# Patient Record
Sex: Male | Born: 1980 | Race: Black or African American | Hispanic: No | Marital: Single | Smoking: Current every day smoker
Health system: Southern US, Community
[De-identification: ages and names within clinical notes are randomized; demographics above are authoritative.]

## PROBLEM LIST (undated history)

## (undated) DIAGNOSIS — S069XAA Unspecified intracranial injury with loss of consciousness status unknown, initial encounter: Secondary | ICD-10-CM

## (undated) DIAGNOSIS — S069X9A Unspecified intracranial injury with loss of consciousness of unspecified duration, initial encounter: Secondary | ICD-10-CM

## (undated) HISTORY — PX: FRACTURE SURGERY: SHX138

---
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
## 2004-06-22 ENCOUNTER — Emergency Department (HOSPITAL_COMMUNITY): Admission: EM | Admit: 2004-06-22 | Discharge: 2004-06-22 | Payer: Self-pay | Admitting: Emergency Medicine

## 2004-10-20 ENCOUNTER — Emergency Department (HOSPITAL_COMMUNITY): Admission: EM | Admit: 2004-10-20 | Discharge: 2004-10-21 | Payer: Self-pay

## 2006-07-21 ENCOUNTER — Emergency Department (HOSPITAL_COMMUNITY): Admission: EM | Admit: 2006-07-21 | Discharge: 2006-07-21 | Payer: Self-pay | Admitting: Emergency Medicine

## 2007-05-24 ENCOUNTER — Emergency Department (HOSPITAL_COMMUNITY): Admission: EM | Admit: 2007-05-24 | Discharge: 2007-05-24 | Payer: Self-pay | Admitting: Emergency Medicine

## 2009-07-04 ENCOUNTER — Emergency Department (HOSPITAL_COMMUNITY): Admission: EM | Admit: 2009-07-04 | Discharge: 2009-07-04 | Payer: Self-pay | Admitting: Emergency Medicine

## 2010-09-11 ENCOUNTER — Emergency Department (HOSPITAL_COMMUNITY): Admission: EM | Admit: 2010-09-11 | Discharge: 2010-09-11 | Payer: Self-pay | Admitting: Emergency Medicine

## 2010-11-03 ENCOUNTER — Emergency Department (HOSPITAL_COMMUNITY)
Admission: EM | Admit: 2010-11-03 | Discharge: 2010-11-03 | Payer: Self-pay | Source: Home / Self Care | Admitting: Emergency Medicine

## 2011-01-28 LAB — GC/CHLAMYDIA PROBE AMP, GENITAL: GC Probe Amp, Genital: POSITIVE — AB

## 2011-06-09 ENCOUNTER — Ambulatory Visit: Payer: Self-pay | Attending: Orthopaedic Surgery | Admitting: Physical Therapy

## 2011-06-09 DIAGNOSIS — M25579 Pain in unspecified ankle and joints of unspecified foot: Secondary | ICD-10-CM | POA: Insufficient documentation

## 2011-06-09 DIAGNOSIS — R262 Difficulty in walking, not elsewhere classified: Secondary | ICD-10-CM | POA: Insufficient documentation

## 2011-06-09 DIAGNOSIS — M25673 Stiffness of unspecified ankle, not elsewhere classified: Secondary | ICD-10-CM | POA: Insufficient documentation

## 2011-06-09 DIAGNOSIS — IMO0001 Reserved for inherently not codable concepts without codable children: Secondary | ICD-10-CM | POA: Insufficient documentation

## 2011-06-09 DIAGNOSIS — M25676 Stiffness of unspecified foot, not elsewhere classified: Secondary | ICD-10-CM | POA: Insufficient documentation

## 2011-06-11 ENCOUNTER — Ambulatory Visit: Payer: Self-pay | Admitting: Rehabilitation

## 2011-06-12 ENCOUNTER — Ambulatory Visit: Payer: Self-pay | Admitting: Physical Therapy

## 2011-06-17 ENCOUNTER — Ambulatory Visit: Payer: Self-pay | Attending: Orthopaedic Surgery | Admitting: Physical Therapy

## 2011-06-17 DIAGNOSIS — R262 Difficulty in walking, not elsewhere classified: Secondary | ICD-10-CM | POA: Insufficient documentation

## 2011-06-17 DIAGNOSIS — M25579 Pain in unspecified ankle and joints of unspecified foot: Secondary | ICD-10-CM | POA: Insufficient documentation

## 2011-06-17 DIAGNOSIS — M25676 Stiffness of unspecified foot, not elsewhere classified: Secondary | ICD-10-CM | POA: Insufficient documentation

## 2011-06-17 DIAGNOSIS — IMO0001 Reserved for inherently not codable concepts without codable children: Secondary | ICD-10-CM | POA: Insufficient documentation

## 2011-06-17 DIAGNOSIS — M25673 Stiffness of unspecified ankle, not elsewhere classified: Secondary | ICD-10-CM | POA: Insufficient documentation

## 2011-06-18 ENCOUNTER — Ambulatory Visit: Payer: Self-pay | Admitting: Physical Therapy

## 2011-06-23 ENCOUNTER — Ambulatory Visit: Payer: Self-pay | Admitting: Physical Therapy

## 2011-06-26 ENCOUNTER — Encounter: Payer: Self-pay | Admitting: Physical Therapy

## 2011-06-30 ENCOUNTER — Ambulatory Visit: Payer: Self-pay | Admitting: Physical Therapy

## 2011-07-03 ENCOUNTER — Ambulatory Visit: Payer: Self-pay | Admitting: Physical Therapy

## 2011-07-07 ENCOUNTER — Ambulatory Visit: Payer: Self-pay | Admitting: Physical Therapy

## 2011-07-10 ENCOUNTER — Encounter: Payer: Self-pay | Admitting: Physical Therapy

## 2011-07-13 ENCOUNTER — Encounter: Payer: Self-pay | Admitting: Physical Therapy

## 2011-07-16 ENCOUNTER — Encounter: Payer: Self-pay | Admitting: Physical Therapy

## 2011-07-28 ENCOUNTER — Ambulatory Visit: Payer: Self-pay | Attending: Orthopaedic Surgery | Admitting: Physical Therapy

## 2011-07-28 DIAGNOSIS — IMO0001 Reserved for inherently not codable concepts without codable children: Secondary | ICD-10-CM | POA: Insufficient documentation

## 2011-07-28 DIAGNOSIS — R262 Difficulty in walking, not elsewhere classified: Secondary | ICD-10-CM | POA: Insufficient documentation

## 2011-07-28 DIAGNOSIS — M25579 Pain in unspecified ankle and joints of unspecified foot: Secondary | ICD-10-CM | POA: Insufficient documentation

## 2011-07-28 DIAGNOSIS — M25673 Stiffness of unspecified ankle, not elsewhere classified: Secondary | ICD-10-CM | POA: Insufficient documentation

## 2011-07-28 DIAGNOSIS — M25676 Stiffness of unspecified foot, not elsewhere classified: Secondary | ICD-10-CM | POA: Insufficient documentation

## 2011-07-30 ENCOUNTER — Ambulatory Visit: Payer: Self-pay | Admitting: Physical Therapy

## 2011-08-04 ENCOUNTER — Encounter: Payer: Self-pay | Admitting: Physical Therapy

## 2011-08-06 ENCOUNTER — Encounter: Payer: Self-pay | Admitting: Physical Therapy

## 2011-08-11 ENCOUNTER — Ambulatory Visit: Payer: Self-pay | Admitting: Physical Therapy

## 2011-08-13 ENCOUNTER — Ambulatory Visit: Payer: Self-pay | Admitting: Physical Therapy

## 2011-08-18 ENCOUNTER — Ambulatory Visit: Payer: Self-pay | Attending: Orthopaedic Surgery | Admitting: Physical Therapy

## 2011-08-18 DIAGNOSIS — IMO0001 Reserved for inherently not codable concepts without codable children: Secondary | ICD-10-CM | POA: Insufficient documentation

## 2011-08-18 DIAGNOSIS — M25579 Pain in unspecified ankle and joints of unspecified foot: Secondary | ICD-10-CM | POA: Insufficient documentation

## 2011-08-18 DIAGNOSIS — M25673 Stiffness of unspecified ankle, not elsewhere classified: Secondary | ICD-10-CM | POA: Insufficient documentation

## 2011-08-18 DIAGNOSIS — M25676 Stiffness of unspecified foot, not elsewhere classified: Secondary | ICD-10-CM | POA: Insufficient documentation

## 2011-08-18 DIAGNOSIS — R262 Difficulty in walking, not elsewhere classified: Secondary | ICD-10-CM | POA: Insufficient documentation

## 2011-08-21 ENCOUNTER — Ambulatory Visit: Payer: Self-pay | Admitting: Physical Therapy

## 2012-03-24 DIAGNOSIS — M25419 Effusion, unspecified shoulder: Secondary | ICD-10-CM | POA: Insufficient documentation

## 2012-03-24 DIAGNOSIS — IMO0002 Reserved for concepts with insufficient information to code with codable children: Secondary | ICD-10-CM | POA: Insufficient documentation

## 2012-03-24 DIAGNOSIS — F17200 Nicotine dependence, unspecified, uncomplicated: Secondary | ICD-10-CM | POA: Insufficient documentation

## 2012-03-24 DIAGNOSIS — S40019A Contusion of unspecified shoulder, initial encounter: Secondary | ICD-10-CM | POA: Insufficient documentation

## 2012-03-24 DIAGNOSIS — M25519 Pain in unspecified shoulder: Secondary | ICD-10-CM | POA: Insufficient documentation

## 2012-03-25 ENCOUNTER — Emergency Department (HOSPITAL_COMMUNITY): Payer: Self-pay

## 2012-03-25 ENCOUNTER — Encounter (HOSPITAL_COMMUNITY): Payer: Self-pay | Admitting: Emergency Medicine

## 2012-03-25 ENCOUNTER — Emergency Department (HOSPITAL_COMMUNITY)
Admission: EM | Admit: 2012-03-25 | Discharge: 2012-03-25 | Disposition: A | Discharge status: Home / Self Care | Payer: Self-pay | Attending: Emergency Medicine | Admitting: Emergency Medicine

## 2012-03-25 DIAGNOSIS — S40019A Contusion of unspecified shoulder, initial encounter: Secondary | ICD-10-CM

## 2012-03-25 MED ORDER — IBUPROFEN 600 MG PO TABS: 600.0000 mg | ORAL_TABLET | Freq: Four times a day (QID) | ORAL | Status: AC | PRN

## 2012-03-25 MED ORDER — KETOROLAC TROMETHAMINE 30 MG/ML IJ SOLN
30.0000 mg | Freq: Once | INTRAMUSCULAR | Status: AC
  Administered 2012-03-25: 30 mg via INTRAMUSCULAR
  Filled 2012-03-25: qty 1

## 2012-03-25 NOTE — ED Provider Notes (Signed)
History     CSN: [id number]  Arrival date & time 03/24/12  2334   First MD Initiated Contact with Patient 03/25/12 0124      Chief Complaint  Patient presents with  . Shoulder Injury    right    (Consider location/radiation/quality/duration/timing/severity/associated sxs/prior treatment) HPI Comments: Patient states he locked his keys in the bathroom in his own home.  He was using his right shoulder, as it better, and ran to force the door open.  He is now having shoulder pain without deformity  Patient is a 31 y.o. male presenting with shoulder injury. The history is provided by the patient.  Shoulder Injury This is a new problem. The current episode started today. The problem occurs constantly. Associated symptoms include joint swelling. Pertinent negatives include no numbness or weakness.    History reviewed. No pertinent past medical history.  Past Surgical History  Procedure Date  . Fracture surgery     bilateral ankles and plates in pelvis and rods  to right  forearm    History reviewed. No pertinent family history.  History  Substance Use Topics  . Smoking status: Current Everyday Smoker -- 0.5 packs/day for 10 years  . Smokeless tobacco: Not on file  . Alcohol Use: 2.4 oz/week    2 Cans of beer, 2 Shots of liquor per week      Review of Systems  Musculoskeletal: Positive for joint swelling. Negative for back pain.  Skin: Negative for wound.  Neurological: Negative for dizziness, weakness and numbness.    Allergies  Review of patient's allergies indicates no known allergies.  Home Medications   Current Outpatient Rx  Name Route Sig Dispense Refill  . IBUPROFEN 200 MG PO TABS Oral Take 800 mg by mouth every 8 (eight) hours as needed. For pain    . IBUPROFEN 600 MG PO TABS Oral Take 1 tablet (600 mg total) by mouth every 6 (six) hours as needed for pain. 30 tablet 0    BP 127/90  Pulse 89  Temp(Src) 99 F (37.2 C) (Oral)  Resp 20  SpO2  100%  Physical Exam  Constitutional: He is oriented to person, place, and time. He appears well-developed and well-nourished.  HENT:  Head: Normocephalic.  Eyes: Pupils are equal, round, and reactive to light.  Neck: Normal range of motion.  Cardiovascular: Normal rate.   Pulmonary/Chest: Effort normal.  Musculoskeletal:       Right shoulder: He exhibits decreased range of motion, tenderness, bony tenderness, swelling and pain. He exhibits no effusion, no crepitus, no deformity, no laceration and no spasm.       Clavicle feels intact.  No obvious deformity.  Positive distal pulses, color, and temperature of right arm.  Full range of motion at elbow, wrist, and fingers, decreased range of motion at shoulder, itself  Neurological: He is alert and oriented to person, place, and time.  Skin: Skin is warm and dry.  Psychiatric: He has a normal mood and affect.    ED Course  Procedures (including critical care time)  Labs Reviewed - No data to display Dg Shoulder Right  03/25/2012  *RADIOLOGY REPORT*  Clinical Data: Tried to break down door with right shoulder; anterior right shoulder pain on motion.  RIGHT SHOULDER - 2+ VIEW  Comparison: None.  Findings: There is no evidence of fracture or dislocation.  The right humeral head is seated within the glenoid fossa.  The acromioclavicular joint is unremarkable in appearance.  No significant soft tissue abnormalities  are seen.  The visualized portions of the right lung are clear.  IMPRESSION: No evidence of fracture or dislocation.  Original Report Authenticated By: Tonia Ghent, M.D.     1. Shoulder contusion       MDM  Will apply ice Toradol IM for pain and xray for ? clavicle Fx        Arman Filter, NP 03/25/12 0253  Arman Filter, NP 03/25/12 1610

## 2012-03-25 NOTE — Discharge Instructions (Signed)
Contusion A contusion is a deep bruise. Contusions are the result of an injury that caused bleeding under the skin. The contusion may turn blue, purple, or yellow. Minor injuries will give you a painless contusion, but more severe contusions may stay painful and swollen for a few weeks.  CAUSES  A contusion is usually caused by a blow, trauma, or direct force to an area of the body. SYMPTOMS   Swelling and redness of the injured area.   Bruising of the injured area.   Tenderness and soreness of the injured area.   Pain.  DIAGNOSIS  The diagnosis can be made by taking a history and physical exam. An X-ray, CT scan, or MRI may be needed to determine if there were any associated injuries, such as fractures. TREATMENT  Specific treatment will depend on what area of the body was injured. In general, the best treatment for a contusion is resting, icing, elevating, and applying cold compresses to the injured area. Over-the-counter medicines may also be recommended for pain control. Ask your caregiver what the best treatment is for your contusion. HOME CARE INSTRUCTIONS   Put ice on the injured area.   Put ice in a plastic bag.   Place a towel between your skin and the bag.   Leave the ice on for 15 to 20 minutes, 3 to 4 times a day.   Only take over-the-counter or prescription medicines for pain, discomfort, or fever as directed by your caregiver. Your caregiver may recommend avoiding anti-inflammatory medicines (aspirin, ibuprofen, and naproxen) for 48 hours because these medicines may increase bruising.   Rest the injured area.   If possible, elevate the injured area to reduce swelling.  SEEK IMMEDIATE MEDICAL CARE IF:   You have increased bruising or swelling.   You have pain that is getting worse.   Your swelling or pain is not relieved with medicines.  MAKE SURE YOU:   Understand these instructions.   Will watch your condition.   Will get help right away if you are not  doing well or get worse.  Document Released: 08/12/2005 Document Revised: 10/22/2011 Document Reviewed: 09/07/2011 [facility] Patient Information 2012 [place], [place].Cryotherapy Cryotherapy means treatment with cold. Ice or gel packs can be used to reduce both pain and swelling. Ice is the most helpful within the first 24 to 48 hours after an injury or flareup from overusing a muscle or joint. Sprains, strains, spasms, burning pain, shooting pain, and aches can all be eased with ice. Ice can also be used when recovering from surgery. Ice is effective, has very few side effects, and is safe for most people to use. PRECAUTIONS  Ice is not a safe treatment option for people with:  Raynaud's phenomenon. This is a condition affecting small blood vessels in the extremities. Exposure to cold may cause your problems to return.   Cold hypersensitivity. There are many forms of cold hypersensitivity, including:   Cold urticaria. Red, itchy hives appear on the skin when the tissues begin to warm after being iced.   Cold erythema. This is a red, itchy rash caused by exposure to cold.   Cold hemoglobinuria. Red blood cells break down when the tissues begin to warm after being iced. The hemoglobin that carry oxygen are passed into the urine because they cannot combine with blood proteins fast enough.   Numbness or altered sensitivity in the area being iced.  If you have any of the following conditions, do not use ice until you have discussed  cryotherapy with your caregiver:  Heart conditions, such as arrhythmia, angina, or chronic heart disease.   High blood pressure.   Healing wounds or open skin in the area being iced.   Current infections.   Rheumatoid arthritis.   Poor circulation.   Diabetes.  Ice slows the blood flow in the region it is applied. This is beneficial when trying to stop inflamed tissues from spreading irritating chemicals to surrounding tissues. However, if you expose your skin  to cold temperatures for too long or without the proper protection, you can damage your skin or nerves. Watch for signs of skin damage due to cold. HOME CARE INSTRUCTIONS Follow these tips to use ice and cold packs safely.  Place a dry or damp towel between the ice and skin. A damp towel will cool the skin more quickly, so you may need to shorten the time that the ice is used.   For a more rapid response, add gentle compression to the ice.   Ice for no more than 10 to 20 minutes at a time. The bonier the area you are icing, the less time it will take to get the benefits of ice.   Check your skin after 5 minutes to make sure there are no signs of a poor response to cold or skin damage.   Rest 20 minutes or more in between uses.   Once your skin is numb, you can end your treatment. You can test numbness by very lightly touching your skin. The touch should be so light that you do not see the skin dimple from the pressure of your fingertip. When using ice, most people will feel these normal sensations in this order: cold, burning, aching, and numbness.   Do not use ice on someone who cannot communicate their responses to pain, such as small children or people with dementia.  HOW TO MAKE AN ICE PACK Ice packs are the most common way to use ice therapy. Other methods include ice massage, ice baths, and cryo-sprays. Muscle creams that cause a cold, tingly feeling do not offer the same benefits that ice offers and should not be used as a substitute unless recommended by your caregiver. To make an ice pack, do one of the following:  Place crushed ice or a bag of frozen vegetables in a sealable plastic bag. Squeeze out the excess air. Place this bag inside another plastic bag. Slide the bag into a pillowcase or place a damp towel between your skin and the bag.   Mix 3 parts water with 1 part rubbing alcohol. Freeze the mixture in a sealable plastic bag. When you remove the mixture from the freezer, it  will be slushy. Squeeze out the excess air. Place this bag inside another plastic bag. Slide the bag into a pillowcase or place a damp towel between your skin and the bag.  SEEK MEDICAL CARE IF:  You develop white spots on your skin. This may give the skin a blotchy (mottled) appearance.   Your skin turns blue or pale.   Your skin becomes waxy or hard.   Your swelling gets worse.  MAKE SURE YOU:   Understand these instructions.   Will watch your condition.   Will get help right away if you are not doing well or get worse.  Document Released: 06/29/2011 Document Revised: 10/22/2011 Document Reviewed: 06/29/2011 [facility] Patient Information 2012 [place], [place]. Your x-ray reveals no fractures or dislocations.  Please use ice to the area as instructed ibuprofen on  a regular basis for the next several days

## 2012-03-25 NOTE — ED Provider Notes (Signed)
Medical screening examination/treatment/procedure(s) were performed by non-physician practitioner and as supervising physician I was immediately available for consultation/collaboration.  Clinten Howk R. Brock Larmon, MD 03/25/12 0746 

## 2012-03-25 NOTE — ED Notes (Signed)
Patient reports that he was trying to push open a lock door and injured right shoulder

## 2012-11-21 ENCOUNTER — Emergency Department (HOSPITAL_COMMUNITY): Payer: Self-pay

## 2012-11-21 ENCOUNTER — Encounter (HOSPITAL_COMMUNITY): Payer: Self-pay | Admitting: Cardiology

## 2012-11-21 ENCOUNTER — Emergency Department (HOSPITAL_COMMUNITY)
Admission: EM | Admit: 2012-11-21 | Discharge: 2012-11-21 | Disposition: A | Discharge status: Home / Self Care | Payer: Self-pay | Attending: Emergency Medicine | Admitting: Emergency Medicine

## 2012-11-21 DIAGNOSIS — X58XXXA Exposure to other specified factors, initial encounter: Secondary | ICD-10-CM | POA: Insufficient documentation

## 2012-11-21 DIAGNOSIS — Z8781 Personal history of (healed) traumatic fracture: Secondary | ICD-10-CM | POA: Insufficient documentation

## 2012-11-21 DIAGNOSIS — Z791 Long term (current) use of non-steroidal anti-inflammatories (NSAID): Secondary | ICD-10-CM | POA: Insufficient documentation

## 2012-11-21 DIAGNOSIS — Y939 Activity, unspecified: Secondary | ICD-10-CM | POA: Insufficient documentation

## 2012-11-21 DIAGNOSIS — M25476 Effusion, unspecified foot: Secondary | ICD-10-CM | POA: Insufficient documentation

## 2012-11-21 DIAGNOSIS — G629 Polyneuropathy, unspecified: Secondary | ICD-10-CM

## 2012-11-21 DIAGNOSIS — T84498A Other mechanical complication of other internal orthopedic devices, implants and grafts, initial encounter: Secondary | ICD-10-CM | POA: Insufficient documentation

## 2012-11-21 DIAGNOSIS — Y929 Unspecified place or not applicable: Secondary | ICD-10-CM | POA: Insufficient documentation

## 2012-11-21 DIAGNOSIS — G589 Mononeuropathy, unspecified: Secondary | ICD-10-CM | POA: Insufficient documentation

## 2012-11-21 DIAGNOSIS — F17200 Nicotine dependence, unspecified, uncomplicated: Secondary | ICD-10-CM | POA: Insufficient documentation

## 2012-11-21 DIAGNOSIS — M1990 Unspecified osteoarthritis, unspecified site: Secondary | ICD-10-CM

## 2012-11-21 DIAGNOSIS — M25473 Effusion, unspecified ankle: Secondary | ICD-10-CM | POA: Insufficient documentation

## 2012-11-21 DIAGNOSIS — M19079 Primary osteoarthritis, unspecified ankle and foot: Secondary | ICD-10-CM | POA: Insufficient documentation

## 2012-11-21 MED ORDER — IBUPROFEN 400 MG PO TABS
600.0000 mg | ORAL_TABLET | Freq: Once | ORAL | Status: AC
  Administered 2012-11-21: 600 mg via ORAL
  Filled 2012-11-21: qty 1

## 2012-11-21 MED ORDER — IBUPROFEN 600 MG PO TABS: 600.0000 mg | ORAL_TABLET | Freq: Four times a day (QID) | ORAL | Status: DC | PRN

## 2012-11-21 MED ORDER — HYDROCODONE-ACETAMINOPHEN 5-325 MG PO TABS
1.0000 | ORAL_TABLET | Freq: Once | ORAL | Status: AC
  Administered 2012-11-21: 1 via ORAL
  Filled 2012-11-21: qty 1

## 2012-11-21 MED ORDER — HYDROCODONE-ACETAMINOPHEN 5-325 MG PO TABS: 1.0000 | ORAL_TABLET | ORAL | Status: DC | PRN

## 2012-11-21 NOTE — ED Notes (Signed)
Pt reports he had reconstructive surgery about a year ago on both ankles. Reports recently that he feels like a pin is loose in the right ankle and he feels like he is stepping on needles. States he called his Careers adviser and was told to come the the ED.

## 2012-11-21 NOTE — Progress Notes (Signed)
Orthopedic Tech Progress Note Patient Details:  Bobby Compton [birth year] [id number]  Ortho Devices Type of Ortho Device: Ace wrap;Crutches Ortho Device/Splint Interventions: Application   Jennye Moccasin 11/21/2012, 8:03 PM

## 2012-11-21 NOTE — ED Provider Notes (Signed)
History   This chart was scribed for Bobby Kaplan, MD by Bobby Compton, ED Scribe. This patient was seen in room TR06C/TR06C and the patient's care was started at 7:07 PM    CSN: [id number]  Arrival date & time 11/21/12  1601   None     Chief Complaint  Patient presents with  . Ankle Pain     The history is provided by the patient. No language interpreter was used.   Bobby Compton is a 32 y.o. male with no chronic medical conditions who presents to the Emergency Department complaining of constant, sharp, moderate bilatel ankle pain and swelling gradually worsening over the past week and suddenly worsening last night with no associated recent injuries or falls.  Pt ambulatory with pain over past week, but suddenly worsening pain last night has made ambulation exceedingly difficult.  Pt has a h/o right ankle surgery performed by Dr. Alvia Grove and was in the hospital for 63 days at [facility] states it feels as if one of the screws in his ankle is loose.  Pt denies fever.  Pt is a current everyday smoker and reports alcohol use.    Past Surgical History  Procedure Date  . Fracture surgery     bilateral ankles and plates in pelvis and rods  to right  forearm    History reviewed. No pertinent family history.  History  Substance Use Topics  . Smoking status: Current Every Day Smoker -- 0.5 packs/day for 10 years  . Smokeless tobacco: Not on file  . Alcohol Use: 2.4 oz/week    2 Cans of beer, 2 Shots of liquor per week     Review of Systems  Constitutional: Negative for fever.  Musculoskeletal:       Ankle pain.    Allergies  Review of patient's allergies indicates no known allergies.  Home Medications   Current Outpatient Rx  Name  Route  Sig  Dispense  Refill  . IBUPROFEN 200 MG PO TABS   Oral   Take 800 mg by mouth every 8 (eight) hours as needed. For pain           BP 121/78  Pulse 91  Temp 98.3 F (36.8 C) (Oral)  Resp 18  SpO2 98%  Physical Exam    Nursing note and vitals reviewed. Constitutional: He is oriented to person, place, and time. He appears well-developed and well-nourished. No distress.  HENT:  Head: Normocephalic and atraumatic.  Eyes: Conjunctivae normal are normal.  Neck: Neck supple. No tracheal deviation present.  Cardiovascular: Normal rate.   Pulmonary/Chest: Effort normal. No respiratory distress.  Musculoskeletal: Normal range of motion.       Bilateral ankle exams show evidence of surgeries, right ankle slightly swollen compared to left, no erythema, no callor, no tenderness over medial malleoli, mild tenderness over lateral malleoli, tenderness over anterior part of ankle, feet non-tender, 2+ DP pulses  Neurological: He is alert and oriented to person, place, and time.  Skin: Skin is warm and dry.  Psychiatric: He has a normal mood and affect. His behavior is normal.    ED Course  Procedures (including critical care time) DIAGNOSTIC STUDIES: Oxygen Saturation is 98% on room air, normal by my interpretation.    COORDINATION OF CARE: 7:11 PM- Patient informed of clinical course, understands medical decision-making process, and agrees with plan.  Advised icing the area regularly and follow-up with orthopedist.   Dg Ankle 2 Views Right  11/21/2012  *RADIOLOGY REPORT*  Clinical Data: Pain and swelling.  Ankle surgery 1 year ago.  No recent injury.  RIGHT ANKLE - 2 VIEW  Comparison: None.  Findings: Prior surgery with side plate and screws left tibia, screw traversing through the fibula into the tibia, single screw at the medial malleolar region, K-wires in place anterior aspect of the tibia and screws within the talar dome.  Lucency surrounding the screw traversing through the fibula suggest loosening and / or infection.  Marked degenerative changes at the tibiotalar as well as the tibial fibular articulation.  Lucency lateral aspect of the talar dome. Flattening of the talar dome most notable laterally.  Bony spur.   Findings may reflect result of degenerative changes.  Infection could not be excluded if of high clinical concern.  Mild soft tissue prominence.  Plantar spur.  IMPRESSION: Significant degenerative changes post open reduction and internal fixation of the left ankle fracture. Degenerative changes most notable involving the tibiotalar joint space.  There may be mild collapse of the talar dome laterally.  Loosening of a screw traversing through the left fibula suggested.  If infection were high clinical concern, this could not be excluded based on the radiographic findings as noted above.   Original Report Authenticated By: Lacy Duverney, M.D.      No diagnosis found.    MDM  I personally performed the services described in this documentation, which was scribed in my presence. The recorded information has been reviewed and is accurate.  Pt comes in w/ cc of ankle pain. He has hx of instrumentation in [facility]. He has been having pain for the past few days - with swelling. There is also some burning type, shooting pain.  XRay shows some degeneration and instrumentation related changes. Suspect Neuropathy as well.  Will give RICE tx, and Ortho f/u here. Will likely need PT, and has no PCP.       Bobby Kaplan, MD 11/21/12 2010

## 2012-11-21 NOTE — ED Notes (Signed)
Ortho called for crutches and ace wrap 

## 2013-02-28 ENCOUNTER — Emergency Department (HOSPITAL_COMMUNITY): Payer: Self-pay

## 2013-02-28 ENCOUNTER — Emergency Department (HOSPITAL_COMMUNITY)
Admission: EM | Admit: 2013-02-28 | Discharge: 2013-02-28 | Disposition: A | Discharge status: Home / Self Care | Payer: Self-pay | Attending: Emergency Medicine | Admitting: Emergency Medicine

## 2013-02-28 DIAGNOSIS — R404 Transient alteration of awareness: Secondary | ICD-10-CM | POA: Insufficient documentation

## 2013-02-28 DIAGNOSIS — Y9389 Activity, other specified: Secondary | ICD-10-CM | POA: Insufficient documentation

## 2013-02-28 DIAGNOSIS — S92309A Fracture of unspecified metatarsal bone(s), unspecified foot, initial encounter for closed fracture: Secondary | ICD-10-CM | POA: Insufficient documentation

## 2013-02-28 DIAGNOSIS — M7989 Other specified soft tissue disorders: Secondary | ICD-10-CM | POA: Insufficient documentation

## 2013-02-28 DIAGNOSIS — M25473 Effusion, unspecified ankle: Secondary | ICD-10-CM | POA: Insufficient documentation

## 2013-02-28 DIAGNOSIS — S298XXA Other specified injuries of thorax, initial encounter: Secondary | ICD-10-CM | POA: Insufficient documentation

## 2013-02-28 DIAGNOSIS — Z87828 Personal history of other (healed) physical injury and trauma: Secondary | ICD-10-CM | POA: Insufficient documentation

## 2013-02-28 DIAGNOSIS — F17200 Nicotine dependence, unspecified, uncomplicated: Secondary | ICD-10-CM | POA: Insufficient documentation

## 2013-02-28 DIAGNOSIS — S92302A Fracture of unspecified metatarsal bone(s), left foot, initial encounter for closed fracture: Secondary | ICD-10-CM

## 2013-02-28 DIAGNOSIS — Y92410 Unspecified street and highway as the place of occurrence of the external cause: Secondary | ICD-10-CM | POA: Insufficient documentation

## 2013-02-28 DIAGNOSIS — M25476 Effusion, unspecified foot: Secondary | ICD-10-CM | POA: Insufficient documentation

## 2013-02-28 DIAGNOSIS — S8990XA Unspecified injury of unspecified lower leg, initial encounter: Secondary | ICD-10-CM | POA: Insufficient documentation

## 2013-02-28 MED ORDER — OXYCODONE-ACETAMINOPHEN 5-325 MG PO TABS
1.0000 | ORAL_TABLET | Freq: Once | ORAL | Status: AC
  Administered 2013-02-28: 1 via ORAL
  Filled 2013-02-28: qty 1

## 2013-02-28 MED ORDER — OXYCODONE-ACETAMINOPHEN 5-325 MG PO TABS: 1.0000 | ORAL_TABLET | Freq: Four times a day (QID) | ORAL | Status: DC | PRN

## 2013-02-28 NOTE — ED Notes (Signed)
Pt verbalizes understanding 
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
## 2013-02-28 NOTE — ED Notes (Signed)
Pt LLE warnm to touch.  Numbness but no tingling noted.  Pt unable to wiggle left toes.
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
## 2013-03-05 NOTE — ED Provider Notes (Signed)
Medical screening examination/treatment/procedure(s) were performed by non-physician practitioner and as supervising physician I was immediately available for consultation/collaboration.   Lateya Dauria L Railyn House, MD 03/05/13 2038 

## 2013-04-28 ENCOUNTER — Emergency Department (HOSPITAL_COMMUNITY): Payer: Self-pay

## 2013-04-28 ENCOUNTER — Emergency Department (HOSPITAL_COMMUNITY)
Admission: EM | Admit: 2013-04-28 | Discharge: 2013-04-28 | Disposition: A | Discharge status: Home / Self Care | Payer: Self-pay | Attending: Emergency Medicine | Admitting: Emergency Medicine

## 2013-04-28 ENCOUNTER — Encounter (HOSPITAL_COMMUNITY): Payer: Self-pay | Admitting: Emergency Medicine

## 2013-04-28 DIAGNOSIS — Z79899 Other long term (current) drug therapy: Secondary | ICD-10-CM | POA: Insufficient documentation

## 2013-04-28 DIAGNOSIS — M25511 Pain in right shoulder: Secondary | ICD-10-CM

## 2013-04-28 DIAGNOSIS — Y929 Unspecified place or not applicable: Secondary | ICD-10-CM | POA: Insufficient documentation

## 2013-04-28 DIAGNOSIS — M25519 Pain in unspecified shoulder: Secondary | ICD-10-CM | POA: Insufficient documentation

## 2013-04-28 DIAGNOSIS — X500XXA Overexertion from strenuous movement or load, initial encounter: Secondary | ICD-10-CM | POA: Insufficient documentation

## 2013-04-28 DIAGNOSIS — IMO0002 Reserved for concepts with insufficient information to code with codable children: Secondary | ICD-10-CM | POA: Insufficient documentation

## 2013-04-28 DIAGNOSIS — Y9389 Activity, other specified: Secondary | ICD-10-CM | POA: Insufficient documentation

## 2013-04-28 DIAGNOSIS — T148XXA Other injury of unspecified body region, initial encounter: Secondary | ICD-10-CM

## 2013-04-28 DIAGNOSIS — F17200 Nicotine dependence, unspecified, uncomplicated: Secondary | ICD-10-CM | POA: Insufficient documentation

## 2013-04-28 DIAGNOSIS — M542 Cervicalgia: Secondary | ICD-10-CM | POA: Insufficient documentation

## 2013-04-28 MED ORDER — NAPROXEN 500 MG PO TABS: 500.0000 mg | ORAL_TABLET | Freq: Two times a day (BID) | ORAL | Status: DC

## 2013-04-28 MED ORDER — CYCLOBENZAPRINE HCL 10 MG PO TABS: 10.0000 mg | ORAL_TABLET | Freq: Two times a day (BID) | ORAL | Status: DC | PRN

## 2013-04-28 MED ORDER — IBUPROFEN 800 MG PO TABS
800.0000 mg | ORAL_TABLET | Freq: Once | ORAL | Status: AC
  Administered 2013-04-28: 800 mg via ORAL
  Filled 2013-04-28: qty 1

## 2013-04-28 NOTE — ED Notes (Signed)
Pt reports that awoke one week ago with severe r/shoulder pain. Stated that pain and swelling extends from r/elbow to neck area

## 2013-04-28 NOTE — ED Provider Notes (Signed)
History    This chart was scribed for Raymon Mutton, non-physician practitioner working with Vida Roller, MD by Leone Payor, ED Scribe. This patient was seen in room WTR6/WTR6 and the patient's care was started at 1523.   CSN: [id number]  Arrival date & time 04/28/13  1523   First MD Initiated Contact with Patient 04/28/13 1546      Chief Complaint  Patient presents with  . Shoulder Pain    pt awoke with burning sensation in r/shoulder 1 week ago     The history is provided by the patient. No language interpreter was used.    HPI Comments: Bobby Compton is a 32 y.o. male who presents to the Emergency Department complaining of ongoing, constant, gradually worsening R shoulder pain starting about 1 week ago. States she also has swelling and pain that extends from R elbow to neck area and down cervical spine. States he woke up one morning with this pain. He tried to stretch the R arm and he felt a popping noise. States the pain has been worsening since then. He denies any recent injury or trauma to the area. He has used tramadol with no relief. He is eating and drinking normally. He denies numbness or tingling, fever, chills, visual disturbances, chest pain, pain with swallowing, problems chewing.   History reviewed. No pertinent past medical history.  Past Surgical History  Procedure Laterality Date  . Fracture surgery      bilateral ankles and plates in pelvis and rods  to right  forearm    Family History  Problem Relation Age of Onset  . Diabetes Mother   . Hypertension Mother   . Hypertension Father   . Diabetes Father   . Cancer Other     History  Substance Use Topics  . Smoking status: Current Every Day Smoker -- 0.50 packs/day for 10 years    Types: Cigarettes  . Smokeless tobacco: Not on file  . Alcohol Use: 2.4 oz/week    2 Cans of beer, 2 Shots of liquor per week      Review of Systems  Constitutional: Negative for fever and chills.  HENT: Positive for  neck pain. Negative for trouble swallowing and neck stiffness.   Eyes: Negative for visual disturbance.  Cardiovascular: Negative for chest pain.  Genitourinary: Negative for difficulty urinating.  Musculoskeletal: Positive for arthralgias.  Neurological: Negative for weakness and numbness.    Allergies  Review of patient's allergies indicates no known allergies.  Home Medications   Current Outpatient Rx  Name  Route  Sig  Dispense  Refill  . ibuprofen (ADVIL,MOTRIN) 200 MG tablet   Oral   Take 400 mg by mouth every 6 (six) hours as needed for pain.         . traMADol (ULTRAM) 50 MG tablet   Oral   Take 50 mg by mouth every 6 (six) hours as needed for pain.         Marland Kitchen zolpidem (AMBIEN) 5 MG tablet   Oral   Take 5 mg by mouth at bedtime as needed for sleep.         . cyclobenzaprine (FLEXERIL) 10 MG tablet   Oral   Take 1 tablet (10 mg total) by mouth 2 (two) times daily as needed for muscle spasms.   20 tablet   0   . naproxen (NAPROSYN) 500 MG tablet   Oral   Take 1 tablet (500 mg total) by mouth 2 (two) times daily.  30 tablet   0     BP 121/73  Pulse 76  Temp(Src) 98.7 F (37.1 C) (Oral)  Wt 180 lb (81.647 kg)  SpO2 100%  Physical Exam  Nursing note and vitals reviewed. Constitutional: He is oriented to person, place, and time. He appears well-developed and well-nourished. No distress.  HENT:  Head: Normocephalic and atraumatic.  Mouth/Throat: Oropharynx is clear and moist. No oropharyngeal exudate.  Negative facial swelling. Negative facial asymmetry Uvula midline  Eyes: Conjunctivae and EOM are normal. Pupils are equal, round, and reactive to light.  Neck: Neck supple.  Reproducible pain upon palpation to R aspect of neck. Negative neck swelling. Negative masses. Negative nuchal rigidity.   Cardiovascular: Normal rate, regular rhythm and normal heart sounds.  Exam reveals no friction rub.   No murmur heard. Pulses:      Radial pulses are 2+ on  the right side, and 2+ on the left side.       Dorsalis pedis pulses are 2+ on the right side, and 2+ on the left side.  Pulmonary/Chest: Effort normal and breath sounds normal. No respiratory distress. He has no wheezes. He has no rales.  Musculoskeletal: Normal range of motion. He exhibits tenderness.       Arms: No crepitus upon passive ROM of R shoulder. Strength 5+/5+ to upper extremities bilaterally.   Lymphadenopathy:       Head (right side): No preauricular and no posterior auricular adenopathy present.       Head (left side): No preauricular and no posterior auricular adenopathy present.    He has no cervical adenopathy.       Right cervical: No posterior cervical adenopathy present.      Left cervical: No posterior cervical adenopathy present.  Neurological: He is alert and oriented to person, place, and time. No cranial nerve deficit.  Cranial nerves III-XII grossly intact Sensation intact to upper extremities, bilaterally, with differentiation to sharp and dull touch.  Skin: Skin is warm and dry.  Psychiatric: He has a normal mood and affect. His behavior is normal.    ED Course  Procedures (including critical care time)  DIAGNOSTIC STUDIES: Oxygen Saturation is 100% on room air, normal by my interpretation.    COORDINATION OF CARE: 4:19 PM Discussed treatment plan with pt at bedside and pt agreed to plan.   Labs Reviewed - No data to display Dg Shoulder Right  04/28/2013   *RADIOLOGY REPORT*  Clinical Data: Injury.  RIGHT SHOULDER - 2+ VIEW  Comparison: 03/25/2012  Findings: There is no evidence of fracture or dislocation.  There is no evidence of arthropathy or other focal bone abnormality. Soft tissues are unremarkable.  IMPRESSION: Negative exam.   Original Report Authenticated By: Signa Kell, M.D.     1. Shoulder pain, acute, right   2. Muscle strain       MDM  I personally performed the services described in this documentation, which was scribed in my  presence. The recorded information has been reviewed and is accurate.   Negative nuchal rigidity, negative fevers, negative confusion - less likely meningeal in nature. Negative oropharynx swelling, uvula midline and positive elevation - less likely peritonsillar abscess. No neurological deficits noted. Negative findings on imaging noted. Suspicion to be musculoskeletal in nature. Patient stable, afebrile. Discharge patient with anti-inflammatories and muscle relaxer. Referred patient to adult care clinic and orthopedics. Discussed with patient to continue to rest and stay hydrated. Discussed care with icy-hot ointment. Discussed with patient to continue to  monitor symptoms and if symptoms are to worsen or change to report back to the ED - strict return instructions given.  Patient agreed to plan of care, understood, all questions answered.    Raymon Mutton, PA-C 04/29/13 [phone]

## 2013-04-29 NOTE — ED Provider Notes (Signed)
Medical screening examination/treatment/procedure(s) were performed by non-physician practitioner and as supervising physician I was immediately available for consultation/collaboration.    Vida Roller, MD 04/29/13 1000

## 2013-10-08 ENCOUNTER — Inpatient Hospital Stay (HOSPITAL_COMMUNITY): Payer: Self-pay

## 2013-10-08 ENCOUNTER — Inpatient Hospital Stay (HOSPITAL_COMMUNITY)
Admission: EM | Admit: 2013-10-08 | Discharge: 2013-10-09 | DRG: 101 | Disposition: A | Discharge status: Home / Self Care | Payer: Self-pay | Attending: Internal Medicine | Admitting: Internal Medicine

## 2013-10-08 ENCOUNTER — Encounter (HOSPITAL_COMMUNITY): Payer: Self-pay | Admitting: Emergency Medicine

## 2013-10-08 ENCOUNTER — Emergency Department (HOSPITAL_COMMUNITY): Payer: Self-pay

## 2013-10-08 DIAGNOSIS — S069XAA Unspecified intracranial injury with loss of consciousness status unknown, initial encounter: Secondary | ICD-10-CM

## 2013-10-08 DIAGNOSIS — S069X0S Unspecified intracranial injury without loss of consciousness, sequela: Secondary | ICD-10-CM

## 2013-10-08 DIAGNOSIS — Z23 Encounter for immunization: Secondary | ICD-10-CM

## 2013-10-08 DIAGNOSIS — M6282 Rhabdomyolysis: Secondary | ICD-10-CM | POA: Diagnosis present

## 2013-10-08 DIAGNOSIS — R51 Headache: Secondary | ICD-10-CM

## 2013-10-08 DIAGNOSIS — F1910 Other psychoactive substance abuse, uncomplicated: Secondary | ICD-10-CM | POA: Diagnosis present

## 2013-10-08 DIAGNOSIS — E872 Acidosis, unspecified: Secondary | ICD-10-CM | POA: Diagnosis present

## 2013-10-08 DIAGNOSIS — F1410 Cocaine abuse, uncomplicated: Secondary | ICD-10-CM | POA: Diagnosis present

## 2013-10-08 DIAGNOSIS — F1210 Cannabis abuse, uncomplicated: Secondary | ICD-10-CM | POA: Diagnosis present

## 2013-10-08 DIAGNOSIS — S069X9A Unspecified intracranial injury with loss of consciousness of unspecified duration, initial encounter: Secondary | ICD-10-CM

## 2013-10-08 DIAGNOSIS — F17200 Nicotine dependence, unspecified, uncomplicated: Secondary | ICD-10-CM | POA: Diagnosis present

## 2013-10-08 DIAGNOSIS — R569 Unspecified convulsions: Principal | ICD-10-CM | POA: Diagnosis present

## 2013-10-08 DIAGNOSIS — S069X9S Unspecified intracranial injury with loss of consciousness of unspecified duration, sequela: Secondary | ICD-10-CM

## 2013-10-08 DIAGNOSIS — Z87820 Personal history of traumatic brain injury: Secondary | ICD-10-CM

## 2013-10-08 HISTORY — DX: Unspecified intracranial injury with loss of consciousness status unknown, initial encounter: S06.9XAA

## 2013-10-08 HISTORY — DX: Unspecified intracranial injury with loss of consciousness of unspecified duration, initial encounter: S06.9X9A

## 2013-10-08 LAB — CBC WITH DIFFERENTIAL/PLATELET
Eosinophils Absolute: 0 10*3/uL (ref 0.0–0.7)
Eosinophils Relative: 1 % (ref 0–5)
Hemoglobin: 16.2 g/dL (ref 13.0–17.0)
Lymphocytes Relative: 26 % (ref 12–46)
Lymphs Abs: 2.1 10*3/uL (ref 0.7–4.0)
MCH: 28.5 pg (ref 26.0–34.0)
MCV: 81.5 fL (ref 78.0–100.0)
Monocytes Relative: 7 % (ref 3–12)
Neutrophils Relative %: 67 % (ref 43–77)
RBC: 5.68 MIL/uL (ref 4.22–5.81)
WBC: 8.2 10*3/uL (ref 4.0–10.5)

## 2013-10-08 LAB — BASIC METABOLIC PANEL
CO2: 23 mEq/L (ref 19–32)
Chloride: 97 mEq/L (ref 96–112)
Chloride: 104 mEq/L (ref 96–112)
Creatinine, Ser: 0.92 mg/dL (ref 0.50–1.35)
GFR calc Af Amer: >90 mL/min (ref >90)
GFR calc Af Amer: >90 mL/min (ref >90)
GFR calc non Af Amer: >90 mL/min (ref >90)
Glucose, Bld: 87 mg/dL (ref 70–99)
Potassium: 3.1 mEq/L — ABNORMAL LOW (ref 3.5–5.1)
Potassium: 3.7 mEq/L (ref 3.5–5.1)
Sodium: 137 mEq/L (ref 135–145)

## 2013-10-08 LAB — URINALYSIS, ROUTINE W REFLEX MICROSCOPIC
Bilirubin Urine: NEGATIVE
Glucose, UA: NEGATIVE mg/dL
Hgb urine dipstick: NEGATIVE
Ketones, ur: NEGATIVE mg/dL
Protein, ur: NEGATIVE mg/dL
Urobilinogen, UA: 0.2 mg/dL (ref 0.0–1.0)

## 2013-10-08 LAB — RAPID URINE DRUG SCREEN, HOSP PERFORMED
Amphetamines: POSITIVE — AB
Benzodiazepines: NOT DETECTED
Cocaine: POSITIVE — AB
Opiates: NOT DETECTED
Tetrahydrocannabinol: POSITIVE — AB

## 2013-10-08 LAB — ETHANOL: Alcohol, Ethyl (B): 163 mg/dL — ABNORMAL HIGH (ref 0–11)

## 2013-10-08 LAB — CK: Total CK: 1574 U/L — ABNORMAL HIGH (ref 7–232)

## 2013-10-08 LAB — LACTIC ACID, PLASMA: Lactic Acid, Venous: 0.9 mmol/L (ref 0.5–2.2)

## 2013-10-08 LAB — PROTIME-INR
INR: 1 (ref 0.00–1.49)
Prothrombin Time: 13 seconds (ref 11.6–15.2)

## 2013-10-08 LAB — GLUCOSE, CAPILLARY: Glucose-Capillary: 111 mg/dL — ABNORMAL HIGH (ref 70–99)

## 2013-10-08 MED ORDER — BUTALBITAL-APAP-CAFFEINE 50-325-40 MG PO TABS
1.0000 | ORAL_TABLET | ORAL | Status: DC | PRN
  Administered 2013-10-08 (×3): 1 via ORAL
  Filled 2013-10-08 (×3): qty 1

## 2013-10-08 MED ORDER — INFLUENZA VAC SPLIT QUAD 0.5 ML IM SUSP
0.5000 mL | INTRAMUSCULAR | Status: DC
  Filled 2013-10-08: qty 0.5

## 2013-10-08 MED ORDER — INDOMETHACIN 25 MG PO CAPS
25.0000 mg | ORAL_CAPSULE | Freq: Once | ORAL | Status: AC
  Administered 2013-10-08: 25 mg via ORAL
  Filled 2013-10-08: qty 1

## 2013-10-08 MED ORDER — ACETAMINOPHEN 325 MG PO TABS
650.0000 mg | ORAL_TABLET | ORAL | Status: DC | PRN
  Administered 2013-10-08: 650 mg via ORAL
  Filled 2013-10-08: qty 2

## 2013-10-08 MED ORDER — SODIUM CHLORIDE 0.9 % IV SOLN: INTRAVENOUS | Status: DC

## 2013-10-08 MED ORDER — HEPARIN SODIUM (PORCINE) 5000 UNIT/ML IJ SOLN
5000.0000 [IU] | Freq: Three times a day (TID) | INTRAMUSCULAR | Status: DC
  Filled 2013-10-08 (×4): qty 1

## 2013-10-08 MED ORDER — LORAZEPAM 2 MG/ML IJ SOLN: 1.0000 mg | INTRAMUSCULAR | Status: DC | PRN

## 2013-10-08 MED ORDER — SODIUM CHLORIDE 0.9 % IJ SOLN
3.0000 mL | Freq: Two times a day (BID) | INTRAMUSCULAR | Status: DC
  Administered 2013-10-08 – 2013-10-09 (×2): 3 mL via INTRAVENOUS

## 2013-10-08 MED ORDER — SODIUM CHLORIDE 0.9 % IV SOLN
INTRAVENOUS | Status: DC
  Administered 2013-10-08: 23:00:00 via INTRAVENOUS

## 2013-10-08 MED ORDER — MORPHINE SULFATE 4 MG/ML IJ SOLN: 4.0000 mg | Freq: Once | INTRAMUSCULAR | Status: DC

## 2013-10-08 MED ORDER — FAMOTIDINE 20 MG PO TABS
20.0000 mg | ORAL_TABLET | Freq: Once | ORAL | Status: AC
  Administered 2013-10-08: 20 mg via ORAL
  Filled 2013-10-08: qty 1

## 2013-10-08 MED ORDER — PNEUMOCOCCAL VAC POLYVALENT 25 MCG/0.5ML IJ INJ
0.5000 mL | INJECTION | INTRAMUSCULAR | Status: AC
  Administered 2013-10-09: 0.5 mL via INTRAMUSCULAR
  Filled 2013-10-08: qty 0.5

## 2013-10-08 MED ORDER — GADOBENATE DIMEGLUMINE 529 MG/ML IV SOLN
17.0000 mL | Freq: Once | INTRAVENOUS | Status: AC | PRN
  Administered 2013-10-08: 17 mL via INTRAVENOUS

## 2013-10-08 NOTE — ED Notes (Signed)
Lactic Acid reported to Dr.Otter
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
## 2013-10-08 NOTE — ED Notes (Signed)
MD at [facility] upon pt's arrival to department.

## 2013-10-08 NOTE — ED Provider Notes (Signed)
CSN: [id number]     Arrival date & time 10/08/13  0056 History   First MD Initiated Contact with Patient 10/08/13 0103     Chief Complaint  Patient presents with  . Seizures   (Consider location/radiation/quality/duration/timing/severity/associated sxs/prior Treatment) HPI 32 year old male presents to emergency apartment via EMS from a friend's house with reported seizure.  Patient without prior seizure history.  Per EMS, woman at the house, reported patient was complaining of a headache, and then had to seizures back to back.  Patient has previous history of traumatic brain injury and coma after a fall in 2012.  Patient reports drinking alcohol and smoking marijuana tonight.  Patient has slight bruising around his right eye.  Patient reports he fell.  Patient is oriented to self, and year. Past Medical History  Diagnosis Date  . Traumatic brain injury    Past Surgical History  Procedure Laterality Date  . Fracture surgery      bilateral ankles and plates in pelvis and rods  to right  forearm   Family History  Problem Relation Age of Onset  . Diabetes Mother   . Hypertension Mother   . Hypertension Father   . Diabetes Father   . Cancer Other    History  Substance Use Topics  . Smoking status: Current Every Day Smoker -- 0.50 packs/day for 10 years    Types: Cigarettes  . Smokeless tobacco: Not on file  . Alcohol Use: 2.4 oz/week    2 Cans of beer, 2 Shots of liquor per week    Review of Systems  Unable to perform ROS: Acuity of condition    Allergies  Review of patient's allergies indicates no known allergies.  Home Medications  No current outpatient prescriptions on file. BP 106/56  Pulse 90  Temp(Src) 98.7 F (37.1 C) (Oral)  Resp 16  SpO2 97% Physical Exam  Nursing note and vitals reviewed. Constitutional: He is oriented to person, place, and time. He appears well-developed and well-nourished. He appears distressed (uncomfortable appearing, complaining of  right-sided headache).  HENT:  Head: Normocephalic.  Right Ear: External ear normal.  Left Ear: External ear normal.  Nose: Nose normal.  Mouth/Throat: Oropharynx is clear and moist.  Mild bruising around right eye  Eyes: Conjunctivae and EOM are normal. Pupils are equal, round, and reactive to light.  Neck: Normal range of motion. Neck supple. No JVD present. No tracheal deviation present. No thyromegaly present.  Cardiovascular: Normal rate, regular rhythm, normal heart sounds and intact distal pulses.  Exam reveals no gallop and no friction rub.   No murmur heard. Pulmonary/Chest: Effort normal and breath sounds normal. No stridor. No respiratory distress. He has no wheezes. He has no rales. He exhibits no tenderness.  Abdominal: Soft. Bowel sounds are normal. He exhibits no distension and no mass. There is no tenderness. There is no rebound and no guarding.  Musculoskeletal: Normal range of motion. He exhibits no edema and no tenderness.  Lymphadenopathy:    He has no cervical adenopathy.  Neurological: He is alert and oriented to person, place, and time. He has normal reflexes. No cranial nerve deficit. He exhibits normal muscle tone. Coordination normal.  Skin: Skin is warm and dry. No rash noted. No erythema. No pallor.  Psychiatric: His behavior is normal. Judgment and thought content normal.  Odd affect, tearful    ED Course  Procedures (including critical care time)  CRITICAL CARE Performed by: Olivia Mackie Total critical care time: 30 Critical care time was  exclusive of separately billable procedures and treating other patients. Critical care was necessary to treat or prevent imminent or life-threatening deterioration. Critical care was time spent personally by me on the following activities: development of treatment plan with patient and/or surrogate as well as nursing, discussions with consultants, evaluation of patient's response to treatment, examination of patient,  obtaining history from patient or surrogate, ordering and performing treatments and interventions, ordering and review of laboratory studies, ordering and review of radiographic studies, pulse oximetry and re-evaluation of patient's condition.  Labs Review Labs Reviewed  BASIC METABOLIC PANEL - Abnormal; Notable for the following:    Potassium 3.1 (*)    BUN 5 (*)    All other components within normal limits  URINE RAPID DRUG SCREEN (HOSP PERFORMED) - Abnormal; Notable for the following:    Cocaine POSITIVE (*)    Amphetamines POSITIVE (*)    Tetrahydrocannabinol POSITIVE (*)    All other components within normal limits  ETHANOL - Abnormal; Notable for the following:    Alcohol, Ethyl (B) 163 (*)    All other components within normal limits  CG4 I-STAT (LACTIC ACID) - Abnormal; Notable for the following:    Lactic Acid, Venous 3.35 (*)    All other components within normal limits  CBC WITH DIFFERENTIAL  PROTIME-INR  URINALYSIS, ROUTINE W REFLEX MICROSCOPIC   Imaging Review Ct Head Wo Contrast  10/08/2013   CLINICAL DATA:  Headaches, seizure  EXAM: CT HEAD WITHOUT CONTRAST  TECHNIQUE: Contiguous axial images were obtained from the base of the skull through the vertex without intravenous contrast.  COMPARISON:  None.  FINDINGS: There is no midline shift, hydrocephalus, or mass. No acute hemorrhage or acute transcortical infarct is identified. The bony calvarium is intact. Retention cysts are identified in bilateral maxillary sinuses.  IMPRESSION: No focal acute intracranial abnormality is identified.   Electronically Signed   By: Sherian Rein M.D.   On: 10/08/2013 01:31    EKG Interpretation    Date/Time:  Sunday October 08 2013 01:07:18 EST Ventricular Rate:  91 PR Interval:  156 QRS Duration: 69 QT Interval:  333 QTC Calculation: 410 R Axis:   109 Text Interpretation:  Right and left arm electrode reversal, interpretation assumes no reversal Sinus rhythm Probable right  ventricular hypertrophy Probable lateral infarct, age indeterminate Confirmed by Keimani Laufer  MD, Jmari Pelc ([id number]) on 10/08/2013 1:12:45 AM            MDM   1. Acute headache   2. Seizure    32 year old male with 2 seizures after complaining of headache.  History of prior brain trauma.  Concern for possible aneurysmal bleed, subarachnoid hemorrhage.  CT scan without specific finding.  Patient complaining of persistent headache.  We'll discuss with neurology and get labs.  Expect patient will need admission and further workup.    Olivia Mackie, MD 10/08/13 [phone]
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
## 2013-10-08 NOTE — ED Notes (Signed)
Phlebotomy at BS

## 2013-10-08 NOTE — Consult Note (Signed)
Reason for Consult:Seizure Referring Physician: Norlene Campbell  CC: Seizure  HPI: Bobby Compton is an 32 y.o. male that was at a friend's house last evening and was noted to have two generealized seizures.  The patient is amnestic of the event and all of the history is obtained from the chart.  The patient reports that he had just received some bad news about his mother and was crying on his way to the bathroom.  The next thing he remembers people were shining lights in his eyes.  Patient was noted by a friend to have 2 GTC seizures.  EMS was called and the patient was brought in for evaluation.  Patient now complains of headache and general body pain.   Patient had a fall about 2 years ago where he incurred pelvic fractures and ankle injuries requiring reconstruction.  Although patient reports that he was told that he did not hit his head since that time he has has decreased visual acuity and decreased hearing from his right ear.  He is attempting to obtain disability.  Past Medical History  Diagnosis Date  . Traumatic brain injury     Past Surgical History  Procedure Laterality Date  . Fracture surgery      bilateral ankles and plates in pelvis and rods  to right  forearm    Family History  Problem Relation Age of Onset  . Diabetes Mother   . Hypertension Mother   . Hypertension Father   . Diabetes Father   . Cancer Other     Social History:  reports that he has been smoking Cigarettes.  He has a 5 pack-year smoking history. He does not have any smokeless tobacco history on file. He reports that he drinks about 2.4 ounces of alcohol per week. He reports that he uses illicit drugs (Marijuana). Denied cocaine use but UDS positive for cocaine and amphetamines.  No Known Allergies  Medications: I have reviewed the patient's current medications. Prior to Admission:  No current outpatient prescriptions on file.  ROS: History obtained from the patient  General ROS: negative for - chills,  fatigue, fever, night sweats, weight gain or weight loss Psychological ROS: negative for - behavioral disorder, hallucinations, memory difficulties, mood swings or suicidal ideation Ophthalmic ROS: negative for - blurry vision, double vision, eye pain or loss of vision ENT ROS: negative for - epistaxis, nasal discharge, oral lesions, sore throat, tinnitus or vertigo Allergy and Immunology ROS: negative for - hives or itchy/watery eyes Hematological and Lymphatic ROS: negative for - bleeding problems, bruising or swollen lymph nodes Endocrine ROS: negative for - galactorrhea, hair pattern changes, polydipsia/polyuria or temperature intolerance Respiratory ROS: negative for - cough, hemoptysis, shortness of breath or wheezing Cardiovascular ROS: negative for - chest pain, dyspnea on exertion, edema or irregular heartbeat Gastrointestinal ROS: negative for - abdominal pain, diarrhea, hematemesis, nausea/vomiting or stool incontinence Genito-Urinary ROS: negative for - dysuria, hematuria, incontinence or urinary frequency/urgency Musculoskeletal ROS: generalized pain, pelvic and ankle pain that is chronic Neurological ROS: as noted in HPI Dermatological ROS: negative for rash and skin lesion changes  Physical Examination: Blood pressure 118/70, pulse 86, temperature 98.7 F (37.1 C), temperature source Oral, resp. rate 15, SpO2 100.00%.  Neurologic Examination Mental Status: Alert, oriented, thought content appropriate.  Speech fluent without evidence of aphasia.  Able to follow 3 step commands without difficulty. Cranial Nerves: II: Discs flat bilaterally; Visual fields grossly normal, pupils equal, round, reactive to light and accommodation III,IV, VI: ptosis not present, extra-ocular  motions intact bilaterally V,VII: smile symmetric, facial light touch sensation normal bilaterally VIII: hearing normal bilaterally IX,X: gag reflex present XI: bilateral shoulder shrug XII: midline tongue  extension Motor: Right : Upper extremity   5/5     Left:     Upper extremity   5/5  Lower extremity   5/5 proximally    Lower extremity   5/5 proximally Patient 0/5 with ankle flexion and extension bilaterally secondary to fusion.  Tone and bulk:normal tone throughout; no atrophy noted Sensory: Pinprick and light touch intact throughout, bilaterally Deep Tendon Reflexes: 2+ and symmetric with absent AJ's bilaterallly Plantars: Right: downgoing   Left: downgoing Cerebellar: normal finger-to-nose.  In to much pain to cooperate for heel-to-shin testing Gait: Unable to test CV: pulses palpable throughout   Laboratory Studies:   Basic Metabolic Panel:  Recent Labs Lab 10/08/13 0111  NA 138  K 3.1*  CL 97  CO2 23  GLUCOSE 74  BUN 5*  CREATININE 0.92  CALCIUM 9.3    Liver Function Tests: No results found for this basename: AST, ALT, ALKPHOS, BILITOT, PROT, ALBUMIN,  in the last 168 hours No results found for this basename: LIPASE, AMYLASE,  in the last 168 hours No results found for this basename: AMMONIA,  in the last 168 hours  CBC:  Recent Labs Lab 10/08/13 0111  WBC 8.2  NEUTROABS 5.5  HGB 16.2  HCT 46.3  MCV 81.5  PLT 341    Cardiac Enzymes: No results found for this basename: CKTOTAL, CKMB, CKMBINDEX, TROPONINI,  in the last 168 hours  BNP: No components found with this basename: POCBNP,   CBG: No results found for this basename: GLUCAP,  in the last 168 hours  Microbiology: Results for orders placed during the hospital encounter of 09/11/10  GC/CHLAMYDIA PROBE AMP, GENITAL     Status: Abnormal   Collection Time    09/11/10  2:56 PM      Result Value Range Status   GC Probe Amp, Genital   (*) NEGATIVE Final   Value: POSITIVE     (NOTE)  Testing performed using the BD ProbeTec Qx Chlamydia trachomatis and Neisseria gonorrhea amplified DNA assay.  Performed at:  [facility]               [address]                    [place], [place] [address]               [id number]   Chlamydia, DNA Probe   (*) NEGATIVE Final   Value: POSITIVE     (NOTE)  Testing performed using the BD ProbeT[employer] Qx Chlamydia trachomatis and Neisseria gonorrhea amplified DNA assay.  Performed at:  [facility]               [address]                    [place], [place] [address]               [id number]    Coagulation Studies:  Recent Labs  10/08/13 0111  LABPROT 13.0  INR 1.00    Urinalysis:  Recent Labs Lab 10/08/13 0219  COLORURINE YELLOW  LABSPEC 1.009  PHURINE 6.0  GLUCOSEU NEGATIVE  HGBUR NEGATIVE  BILIRUBINUR NEGATIVE  KETONESUR NEGATIVE  PROTEINUR NEGATIVE  UROBILINOGEN 0.2  NITRITE NEGATIVE  LEUKOCYTESUR NEGATIVE    Lipid Panel:  No results found for this basename: chol, trig, hdl, cholhdl, vldl, ldlcalc    HgbA1C:  No results found for this basename: HGBA1C    Urine Drug Screen:     Component Value Date/Time   LABOPIA NONE DETECTED 10/08/2013 0219   COCAINSCRNUR POSITIVE* 10/08/2013 0219   LABBENZ NONE DETECTED 10/08/2013 0219   AMPHETMU POSITIVE* 10/08/2013 0219   THCU POSITIVE* 10/08/2013 0219   LABBARB NONE DETECTED 10/08/2013 0219    Alcohol Level:  Recent Labs Lab 10/08/13 0146  ETH 163*    Other results: EKG: sinus rhythm at 91 bpm.  Imaging: Ct Head Wo Contrast  10/08/2013   CLINICAL DATA:  Headaches, seizure  EXAM: CT HEAD WITHOUT CONTRAST  TECHNIQUE: Contiguous axial images were obtained from the base of the skull through the vertex without intravenous contrast.  COMPARISON:  None.  FINDINGS: There is no midline shift, hydrocephalus, or mass. No acute hemorrhage or acute transcortical infarct is identified. The bony calvarium is intact. Retention cysts are identified in bilateral maxillary sinuses.  IMPRESSION: No focal acute intracranial abnormality is identified.   Electronically Signed   By:  Sherian Rein M.D.   On: 10/08/2013 01:31     Assessment/Plan: 32 year old male presenting after having two witnessed seizures.  Head CT reviewed and shows no acute changes.  Although patient with history of possible head trauma in the past, he has had no previous seizures and currently has multiple illicit drugs on board.  Some of the drug use may have been a precipitator of the presenting events leading to the seizures being provoked.  With this in mind anticonvulsant therapy is not indicated at this time but further work up is recommended.    Recommendations: 1.  MRI of the brain with and without contrast 2.  EEG 3.  No anticonvulsant therapy at this time 4.  Ativan prn  Thana Farr, MD [facility] [phone] 10/08/2013, 6:24 AM

## 2013-10-09 ENCOUNTER — Inpatient Hospital Stay (HOSPITAL_COMMUNITY): Payer: Self-pay

## 2013-10-09 LAB — COMPREHENSIVE METABOLIC PANEL
ALT: 16 U/L (ref 0–53)
Albumin: 3.5 g/dL (ref 3.5–5.2)
Alkaline Phosphatase: 88 U/L (ref 39–117)
CO2: 26 mEq/L (ref 19–32)
Calcium: 8.9 mg/dL (ref 8.4–10.5)
Chloride: 102 mEq/L (ref 96–112)
Creatinine, Ser: 1.23 mg/dL (ref 0.50–1.35)
GFR calc Af Amer: 89 mL/min — ABNORMAL LOW (ref >90)
GFR calc non Af Amer: 77 mL/min — ABNORMAL LOW (ref >90)
Glucose, Bld: 94 mg/dL (ref 70–99)
Sodium: 139 mEq/L (ref 135–145)
Total Bilirubin: 0.6 mg/dL (ref 0.3–1.2)

## 2013-10-09 LAB — CBC
HCT: 44.8 % (ref 39.0–52.0)
Hemoglobin: 15.3 g/dL (ref 13.0–17.0)
MCH: 28.3 pg (ref 26.0–34.0)
MCHC: 34.2 g/dL (ref 30.0–36.0)
Platelets: 331 10*3/uL (ref 150–400)

## 2013-10-09 LAB — PROTIME-INR
INR: 1.05 (ref 0.00–1.49)
Prothrombin Time: 13.5 seconds (ref 11.6–15.2)

## 2013-10-09 NOTE — Progress Notes (Signed)
EEG completed; results pending.    

## 2013-10-09 NOTE — Discharge Summary (Signed)
DISCHARGE SUMMARY  Bobby Compton  MR#: [id number]  DOB:[birth year]  Date of Admission: 10/08/2013 Date of Discharge: 10/09/2013  Attending Physician:Camiya Vinal T  Patient's NWG:[id number] Not In System  Consults: Neurology   Disposition: D/C home      Follow-up Information   Follow up with Obtain a primary care provider and make an appointment within the next 3-4 weeks for routine medical care.Marland Kitchen     Discharge Diagnoses: Seizure  Rhabdomyolysis  Lactic acidosis  Hx of traumatic fall w/ B ankle and pelvic injuries - query TBI  Substance abuse   Initial presentation: 32 y.o. male with history of traumatic brain injury and substance abuse admitted from home. The patient was at his friend's house and had been drinking alcohol and smoking marijuana when hile walking to the bathroom he had a fall and then had 2 tonic-clonic seizures. When EMS arrived the patient was found post ictal / confused.   Hospital Course:  Seizure  Neurology has evaluated - MRI brain unrevealing - EEG w/o findings - on no sz meds - likely related to drug abuse (cocaine) - pt has returned to baseline mental status - he has no complaints whatsoever - he desires d/c home - no further medical evaluation indicated at this time   Rhabdomyolysis  Mild - due to sz activiyt - advised pt to increase intake of H2O for the next 7 days  Lactic acidosis  resolved - due to seizure and associated mild rhabdo  Hx of traumatic fall w/ B ankle and pelvic injuries - query TBI  At his baseline medical status   Substance abuse  UDS + for amphetamines, Cocaine, and THC - counseled pt directly on absolute need to d/c all abuse - advised pt of risk of death from MI, large CVA, or Sz related to ongoing drug abuse    Medication List    Notice   You have not been prescribed any medications.     Day of Discharge BP 104/75  Pulse 75  Temp(Src) 97.6 F (36.4 C) (Oral)  Resp 17  Ht 6\' 2"  (1.88 m)  Wt 86.9 kg (191  lb 9.3 oz)  BMI 24.59 kg/m2  SpO2 98%  Physical Exam: General: No acute respiratory distress Lungs: Clear to auscultation bilaterally without wheezes or crackles Cardiovascular: Regular rate and rhythm without murmur gallop or rub normal S1 and S2 Abdomen: Nontender, nondistended, soft, bowel sounds positive, no rebound, no ascites, no appreciable mass Extremities: No significant cyanosis, clubbing, or edema bilateral lower extremities  COMPREHENSIVE METABOLIC PANEL     Status: Abnormal   Collection Time    10/09/13  4:45 AM      Result Value Range   Sodium 139  135 - 145 mEq/L   Potassium 3.7  3.5 - 5.1 mEq/L   Chloride 102  96 - 112 mEq/L   CO2 26  19 - 32 mEq/L   Glucose, Bld 94  70 - 99 mg/dL   BUN 10  6 - 23 mg/dL   Creatinine, Ser 6.57  0.50 - 1.35 mg/dL   Calcium 8.9  8.4 - 84.6 mg/dL   Total Protein 6.7  6.0 - 8.3 g/dL   Albumin 3.5  3.5 - 5.2 g/dL   AST 27  0 - 37 U/L   ALT 16  0 - 53 U/L   Alkaline Phosphatase 88  39 - 117 U/L   Total Bilirubin 0.6  0.3 - 1.2 mg/dL   GFR calc non Af Amer 77 (*) >90 mL/min  GFR calc Af Amer 89 (*) >90 mL/min  CBC     Status: None   Collection Time    10/09/13  4:45 AM      Result Value Range   WBC 6.2  4.0 - 10.5 K/uL   RBC 5.40  4.22 - 5.81 MIL/uL   Hemoglobin 15.3  13.0 - 17.0 g/dL   HCT 16.1  09.6 - 04.5 %   MCV 83.0  78.0 - 100.0 fL   MCH 28.3  26.0 - 34.0 pg   MCHC 34.2  30.0 - 36.0 g/dL   RDW 40.9  81.1 - 91.4 %   Platelets 331  150 - 400 K/uL   Time spent in discharge (includes decision making & examination of pt): 30 minutes  10/09/2013, 11:06 AM   Lonia Blood, MD [facility] Office  [phone] Pager [phone]  On-Call/Text Page:      [email or web address]      password [facility]

## 2013-10-09 NOTE — Progress Notes (Signed)
Pt. D/c'd home.  No complications.  Elink notified.

## 2013-10-09 NOTE — Procedures (Signed)
EEG report.  Brief clinical history:32 year old male presenting after having two witnessed seizures.  Technique: this is a 17 channel routine scalp EEG performed at the bedside with bipolar and monopolar montages arranged in accordance to the international 10/20 system of electrode placement. One channel was dedicated to EKG recording.  The study was performed during wakefulness, drowsiness, and stage 2 sleep. Hyperventilation and Intermittent photic stimulation were utilized as activating procedures.  Description:In the wakeful state, the best background consisted of a medium amplitude, posterior dominant, well sustained, symmetric and reactive 10 Hz rhythm. Drowsiness demonstrated dropout of the alpha rhythm. Stage 2 sleep showed symmetric and synchronous sleep spindles without intermixed epileptiform discharges. Hyperventilation induced mild, diffuse, physiologic slowing but no epileptiform discharges. Intermittent photic stimulation did induce a normal driving response.  No focal or generalized epileptiform discharges noted.  No slowing seen.  EKG showed sinus rhythm.  Impression: this is a normal awake and asleep EEG. Please, be aware that a normal EEG does not exclude the possibility of epilepsy.  Clinical correlation is advised.  Wyatt Portela, MD

## 2014-06-21 ENCOUNTER — Encounter (HOSPITAL_COMMUNITY): Payer: Self-pay | Admitting: Emergency Medicine

## 2014-06-21 ENCOUNTER — Emergency Department (HOSPITAL_COMMUNITY)
Admission: EM | Admit: 2014-06-21 | Discharge: 2014-06-21 | Disposition: A | Discharge status: Home / Self Care | Payer: Self-pay | Attending: Emergency Medicine | Admitting: Emergency Medicine

## 2014-06-21 ENCOUNTER — Emergency Department (HOSPITAL_COMMUNITY): Payer: Self-pay

## 2014-06-21 DIAGNOSIS — M25572 Pain in left ankle and joints of left foot: Secondary | ICD-10-CM

## 2014-06-21 DIAGNOSIS — M25579 Pain in unspecified ankle and joints of unspecified foot: Secondary | ICD-10-CM | POA: Insufficient documentation

## 2014-06-21 DIAGNOSIS — G8929 Other chronic pain: Secondary | ICD-10-CM | POA: Insufficient documentation

## 2014-06-21 DIAGNOSIS — Z87820 Personal history of traumatic brain injury: Secondary | ICD-10-CM | POA: Insufficient documentation

## 2014-06-21 DIAGNOSIS — F17200 Nicotine dependence, unspecified, uncomplicated: Secondary | ICD-10-CM | POA: Insufficient documentation

## 2014-06-21 DIAGNOSIS — S8290XD Unspecified fracture of unspecified lower leg, subsequent encounter for closed fracture with routine healing: Secondary | ICD-10-CM | POA: Insufficient documentation

## 2014-06-21 DIAGNOSIS — M25571 Pain in right ankle and joints of right foot: Secondary | ICD-10-CM

## 2014-06-21 DIAGNOSIS — Z79899 Other long term (current) drug therapy: Secondary | ICD-10-CM | POA: Insufficient documentation

## 2014-06-21 MED ORDER — ONDANSETRON 8 MG PO TBDP
8.0000 mg | ORAL_TABLET | Freq: Once | ORAL | Status: AC
  Administered 2014-06-21: 8 mg via ORAL
  Filled 2014-06-21: qty 1

## 2014-06-21 MED ORDER — OXYCODONE-ACETAMINOPHEN 5-325 MG PO TABS: 1.0000 | ORAL_TABLET | Freq: Four times a day (QID) | ORAL | Status: DC | PRN

## 2014-06-21 MED ORDER — PROMETHAZINE HCL 25 MG PO TABS: 25.0000 mg | ORAL_TABLET | Freq: Four times a day (QID) | ORAL | Status: DC | PRN

## 2014-06-21 MED ORDER — OXYCODONE-ACETAMINOPHEN 5-325 MG PO TABS
2.0000 | ORAL_TABLET | Freq: Once | ORAL | Status: AC
  Administered 2014-06-21: 2 via ORAL
  Filled 2014-06-21: qty 2

## 2014-06-21 NOTE — ED Notes (Signed)
Pt with previous bilateral ankle surgeries from falling from a parking deck is having bilateral ankle pain that is worse on the right side. Reports "I think a screw has moved or coming out" on the right side. Pt unable to ambulate today but is able to with a cane usually.

## 2014-06-21 NOTE — Discharge Instructions (Signed)
Use moleskin over the areas of your ankle that are particularly swollen to avoid skin breakdown. Try to get health insurance using the [facility] Care Act so you will be able to see a specialist for more definitive treatment of your ankle pain.   Musculoskeletal Pain Musculoskeletal pain is muscle and boney aches and pains. These pains can occur in any part of the body. Your caregiver may treat you without knowing the cause of the pain. They may treat you if blood or urine tests, X-rays, and other tests were normal.  CAUSES There is often not a definite cause or reason for these pains. These pains may be caused by a type of germ (virus). The discomfort may also come from overuse. Overuse includes working out too hard when your body is not fit. Boney aches also come from weather changes. Bone is sensitive to atmospheric pressure changes. HOME CARE INSTRUCTIONS   Ask when your test results will be ready. Make sure you get your test results.  Only take over-the-counter or prescription medicines for pain, discomfort, or fever as directed by your caregiver. If you were given medications for your condition, do not drive, operate machinery or power tools, or sign legal documents for 24 hours. Do not drink alcohol. Do not take sleeping pills or other medications that may interfere with treatment.  Continue all activities unless the activities cause more pain. When the pain lessens, slowly resume normal activities. Gradually increase the intensity and duration of the activities or exercise.  During periods of severe pain, bed rest may be helpful. Lay or sit in any position that is comfortable.  Putting ice on the injured area.  Put ice in a bag.  Place a towel between your skin and the bag.  Leave the ice on for 15 to 20 minutes, 3 to 4 times a day.  Follow up with your caregiver for continued problems and no reason can be found for the pain. If the pain becomes worse or does not go away, it  may be necessary to repeat tests or do additional testing. Your caregiver may need to look further for a possible cause. SEEK IMMEDIATE MEDICAL CARE IF:  You have pain that is getting worse and is not relieved by medications.  You develop chest pain that is associated with shortness or breath, sweating, feeling sick to your stomach (nauseous), or throw up (vomit).  Your pain becomes localized to the abdomen.  You develop any new symptoms that seem different or that concern you. MAKE SURE YOU:   Understand these instructions.  Will watch your condition.  Will get help right away if you are not doing well or get worse. Document Released: 11/02/2005 Document Revised: 01/25/2012 Document Reviewed: 07/07/2013 [facility] Patient Information 2015 [place], [place]. This information is not intended to replace advice given to you by your health care provider. Make sure you discuss any questions you have with your health care provider.

## 2014-06-21 NOTE — ED Provider Notes (Signed)
CSN: [id number]     Arrival date & time 06/21/14  1154 History   First MD Initiated Contact with Patient 06/21/14 1210    This chart was scribed for non-physician practitioner, Junious SilkHannah Alfred Eckley, working with Rolland PorterMark James, MD by Marica OtterNusrat Rahman, ED Scribe. This patient was seen in room WTR8/WTR8 and the patient's care was started at 1:24 PM.  No chief complaint on file.  The history is provided by the patient. No language interpreter was used.   HPI Comments: Bobby Compton is a 33 y.o. male, with a Hx of fracture surgery involving bilateral ankles with plate placement in pelvis and rods to right forearm and chronic bilateral ankle pain, who presents to the Emergency Department complaining of worsening bilateral ankle pain with associated bilateral leg pain and swelling to the feet bilaterally, onset 2 days ago. Pt rates his left ankle pain an 8 out of 10 and right ankle pain a 10 out of 10, noting that the pain is worse on the right side than the left. Pt notes that while at baseline he is ambulatory with the assistance of a cane, he has been unable to walk today due to the pain. Pt reports using tramadol at home without relief. Pt denies seeing an orthopedic doctor recently or having an orthopedic doctor in the area. Pt further notes that he is unable to work and is on disability due to his leg pain. Pt denies chest pain, SOB.   Past Medical History  Diagnosis Date  . Traumatic brain injury    Past Surgical History  Procedure Laterality Date  . Fracture surgery      bilateral ankles and plates in pelvis and rods  to right  forearm   Family History  Problem Relation Age of Onset  . Diabetes Mother   . Hypertension Mother   . Hypertension Father   . Diabetes Father   . Cancer Other    History  Substance Use Topics  . Smoking status: Current Every Day Smoker -- 0.50 packs/day for 10 years    Types: Cigarettes  . Smokeless tobacco: Not on file  . Alcohol Use: 2.4 oz/week    2 Cans of beer, 2  Shots of liquor per week    Review of Systems  Constitutional: Negative for fever and chills.  Respiratory: Negative for chest tightness and shortness of breath.   Cardiovascular: Negative for chest pain.  Musculoskeletal:       Bilateral ankle pain with pain worse on right ankle than left. Bilateral leg pain. Swelling of feet bilaterally.   Psychiatric/Behavioral: Negative for confusion.      Allergies  Pork-derived products  Home Medications   Prior to Admission medications   Medication Sig Start Date End Date Taking? Authorizing Provider  traMADol (ULTRAM) 50 MG tablet Take 100 mg by mouth 4 (four) times daily.   Yes Historical Provider, MD   Triage Vitals: BP 130/80  Pulse 81  Temp(Src) 98.3 F (36.8 C) (Oral)  Resp 16  SpO2 100% Physical Exam  Nursing note and vitals reviewed. Constitutional: He is oriented to person, place, and time. He appears well-developed and well-nourished. No distress.  HENT:  Head: Normocephalic and atraumatic.  Right Ear: External ear normal.  Left Ear: External ear normal.  Nose: Nose normal.  Eyes: Conjunctivae are normal.  Neck: Normal range of motion. No tracheal deviation present.  Cardiovascular: Normal rate, regular rhythm and normal heart sounds.   Pulmonary/Chest: Effort normal and breath sounds normal. No stridor.  Abdominal:  Soft. He exhibits no distension. There is no tenderness.  Musculoskeletal: He exhibits tenderness.  2 mm calcified area to the lateral aspect of right ankle which moves with tendon bilateral ankle swelling. Scars from prior ankle surgeries which appear to be healing well. Neurovascularly intact compartments soft. 2+ DP pulses bilaterally.   Neurological: He is alert and oriented to person, place, and time.  Skin: Skin is warm and dry. He is not diaphoretic.  Psychiatric: He has a normal mood and affect. His behavior is normal.    ED Course  Procedures (including critical care time) DIAGNOSTIC  STUDIES: Oxygen Saturation is 100% on RA, nl by my interpretation.    COORDINATION OF CARE: 1:28 PM-Discussed treatment plan which includes discussing imaging results and stronger pain meds with pt at bedside and pt agreed to plan. Pt states he has a ride home from the ED.   Labs Review Labs Reviewed - No data to display  Imaging Review Dg Ankle Complete Left  06/21/2014   CLINICAL DATA:  Bilateral ankle pain  EXAM: LEFT ANKLE COMPLETE - 3+ VIEW  COMPARISON:  Left foot 02/28/2013.  FINDINGS: Three views of the left ankle submitted. No acute fracture or subluxation. Again noted postsurgical changes distal tibia and upper talus. Degenerative changes tibiotalar joint. There are degenerative changes fibulotalar joint. Ankle mortise is preserved.  IMPRESSION: No acute fracture or subluxation. Postsurgical changes as described above. Degenerative changes ankle joint.   Electronically Signed   By: Natasha Mead M.D.   On: 06/21/2014 13:13   Dg Ankle Complete Right  06/21/2014   CLINICAL DATA:  Bilateral ankle pain  EXAM: RIGHT ANKLE - COMPLETE 3+ VIEW  COMPARISON:  02/28/2013  FINDINGS: Three views of the right ankle submitted. Again noted metallic fixation material distal tibia and upper talus. Extensive degenerative changes ankle joint are again noted. No acute fracture or subluxation. Diffuse mild soft tissue swelling.  IMPRESSION: No acute fracture or subluxation. Post traumatic and postsurgical changes as described above. Degenerative changes ankle joint.   Electronically Signed   By: Natasha Mead M.D.   On: 06/21/2014 13:16     EKG Interpretation None      MDM   Final diagnoses:  Bilateral ankle pain    Patient presents to ED with bilateral ankle pain. Injury 3 years ago with hardware placement. XR shows degenerative changes and postsurgical changes, but nothing acute. Patient was instructed that he needs to follow up with orthopedics. He was encouraged to try physical therapy. Patient given pain  medication. Discussed reasons to return to ED immediately. Vital signs stable for discharge. Patient / Family / Caregiver informed of clinical course, understand medical decision-making process, and agree with plan.   I personally performed the services described in this documentation, which was scribed in my presence. The recorded information has been reviewed and is accurate.    Mora Bellman, PA-C 06/21/14 2114

## 2014-06-22 NOTE — ED Provider Notes (Signed)
Pt seen and examined with Shon HoughH. Merril PA. Localized painful area in Soft Tissues of rt ankle anterior-lateral. mobile within ST/tendon.  No hardware underlying this area on XR.  Would likely benefit from PT and Acetic acid iontophoresis, vs Orthopedic referral for removal.  Given referrals for both.  Pt pening Medical Insurance thus would be paying out of pocket.  Given [employer] information.  Encouraged to pursue insurance options, Film/video editorAmerican Healthcare Act info.  Rolland PorterMark Rose Hegner, MD 06/22/14 [phone]

## 2014-09-28 ENCOUNTER — Emergency Department (HOSPITAL_COMMUNITY)
Admission: EM | Admit: 2014-09-28 | Discharge: 2014-09-28 | Disposition: A | Discharge status: Home / Self Care | Payer: Self-pay | Attending: Emergency Medicine | Admitting: Emergency Medicine

## 2014-09-28 ENCOUNTER — Encounter (HOSPITAL_COMMUNITY): Payer: Self-pay

## 2014-09-28 ENCOUNTER — Emergency Department (HOSPITAL_COMMUNITY): Payer: No Typology Code available for payment source

## 2014-09-28 DIAGNOSIS — M25579 Pain in unspecified ankle and joints of unspecified foot: Secondary | ICD-10-CM

## 2014-09-28 DIAGNOSIS — M25571 Pain in right ankle and joints of right foot: Secondary | ICD-10-CM

## 2014-09-28 DIAGNOSIS — Z8781 Personal history of (healed) traumatic fracture: Secondary | ICD-10-CM | POA: Insufficient documentation

## 2014-09-28 DIAGNOSIS — Y998 Other external cause status: Secondary | ICD-10-CM | POA: Insufficient documentation

## 2014-09-28 DIAGNOSIS — S79921A Unspecified injury of right thigh, initial encounter: Secondary | ICD-10-CM | POA: Insufficient documentation

## 2014-09-28 DIAGNOSIS — W19XXXA Unspecified fall, initial encounter: Secondary | ICD-10-CM

## 2014-09-28 DIAGNOSIS — S99911A Unspecified injury of right ankle, initial encounter: Secondary | ICD-10-CM | POA: Insufficient documentation

## 2014-09-28 DIAGNOSIS — Y92003 Bedroom of unspecified non-institutional (private) residence as the place of occurrence of the external cause: Secondary | ICD-10-CM | POA: Insufficient documentation

## 2014-09-28 DIAGNOSIS — Z720 Tobacco use: Secondary | ICD-10-CM | POA: Insufficient documentation

## 2014-09-28 DIAGNOSIS — W06XXXA Fall from bed, initial encounter: Secondary | ICD-10-CM | POA: Insufficient documentation

## 2014-09-28 DIAGNOSIS — Z79899 Other long term (current) drug therapy: Secondary | ICD-10-CM | POA: Insufficient documentation

## 2014-09-28 DIAGNOSIS — Z87820 Personal history of traumatic brain injury: Secondary | ICD-10-CM | POA: Insufficient documentation

## 2014-09-28 DIAGNOSIS — Y9389 Activity, other specified: Secondary | ICD-10-CM | POA: Insufficient documentation

## 2014-09-28 DIAGNOSIS — M25572 Pain in left ankle and joints of left foot: Secondary | ICD-10-CM

## 2014-09-28 DIAGNOSIS — G8929 Other chronic pain: Secondary | ICD-10-CM

## 2014-09-28 DIAGNOSIS — S99912A Unspecified injury of left ankle, initial encounter: Secondary | ICD-10-CM | POA: Insufficient documentation

## 2014-09-28 MED ORDER — KETOROLAC TROMETHAMINE 30 MG/ML IJ SOLN
30.0000 mg | Freq: Once | INTRAMUSCULAR | Status: AC
  Administered 2014-09-28: 30 mg via INTRAVENOUS
  Filled 2014-09-28: qty 1

## 2014-09-28 MED ORDER — KETOROLAC TROMETHAMINE 60 MG/2ML IM SOLN
60.0000 mg | Freq: Once | INTRAMUSCULAR | Status: DC
  Filled 2014-09-28: qty 2

## 2014-09-28 MED ORDER — KETOROLAC TROMETHAMINE 30 MG/ML IJ SOLN: 60.0000 mg | Freq: Once | INTRAMUSCULAR | Status: DC

## 2014-09-28 MED ORDER — NAPROXEN 500 MG PO TABS: 500.0000 mg | ORAL_TABLET | Freq: Two times a day (BID) | ORAL | Status: DC

## 2014-09-28 MED ORDER — HYDROCODONE-ACETAMINOPHEN 5-325 MG PO TABS
2.0000 | ORAL_TABLET | Freq: Once | ORAL | Status: AC
  Administered 2014-09-28: 2 via ORAL
  Filled 2014-09-28: qty 2

## 2014-09-28 MED ORDER — ACETAMINOPHEN 500 MG PO TABS
1000.0000 mg | ORAL_TABLET | Freq: Once | ORAL | Status: AC
  Administered 2014-09-28: 1000 mg via ORAL
  Filled 2014-09-28: qty 2

## 2014-09-28 NOTE — Discharge Instructions (Signed)
Elevate your ankle. Use ice packs for comfort. Use the crutches until you feel you can walk on your ankle again. You need to see an orthopedist to discuss removal of the pin that is protruding under your skin. Take the naproxen as prescribed, when it is gone you can also take Aleve 2 tabs OTC twice a day for pain.  Unfortunately, if you need chronic pain medication, you will need to go to a pain clinic.

## 2014-09-28 NOTE — Discharge Planning (Signed)
CARE MANAGEMENT ED NOTE 09/28/2014  Patient:  Bobby Compton, Bobby Compton   Account Number:  [id number]  Date Initiated:  09/28/2014  Documentation initiated by:  [facility]  Subjective/Objective Assessment:   Chief Complaint    Patient presents with    .  Ankle Pain //PCP [facility]     Subjective/Objective Assessment Detail:   He states today about 520 he was getting up out of bed and when he stepped on his right ankle it gave out and he fell down. He states this has happened once before. He states the limits on the door was open and he was able to grab it to help his fall and he did not hit his head or have loss of consciousness. He complains of pain in his right 5 and his right ankle.     Action/Plan:   Pt given crutches to use.   Action/Plan Detail:   Patient has chronic pain in his ankle. He has been prescribed oxycodone by his PCP, however they have told him he needs to start going to a pain management clinic, however he states he can't pay the initial $100 to be seen.   Anticipated DC Date:  09/28/2014     Status Recommendation to Physician:   Result of Recommendation:      DC Planning Services  CM consult    Choice offered to / List presented to:            Status of service:  Completed, signed off  ED Comments:  He has been prescribed oxycodone by his PCP, however they have told him he needs to start going to a pain management clinic, however he states he can't pay the initial $100 to be seen. PCP [facility].     ED Comments Detail:  Yannely Kintzel J. Clydene Laming, RN, [place], [place] [phone] ED CM Consulted to meet with patient concerning pt not having insurance. Pt presented to [facility] ED today with chronic ankle pain.  Met with patient at bedside, confirmed informaton. Pt  reports not having access to f/u care with PCP [facility]), or insurance coverage. Discussed with patient importance and benefits of  establishing PCP, and not utilizing the ED for primary care needs. Pt  verbalized understanding and is in agreement. Discussed other options, provided list of local  affordable PCPs.  Pt voiced Interest in the [facility] and [place].  [facility] Brochure given with address, phone number, and the services highlighted. Explained that there is a Customer service manager on site who will assist with [employer] and process. Instructed to call or walk in Monday - Friday between the hours of 9- 5:30p to schedule appt for establishing care for PCP. Pt verbalized understanding.

## 2014-09-28 NOTE — ED Notes (Signed)
Pt got up to use the bathroom and fell, has had bilateral ankle replacements, feels as if pin is out of place in right ankle

## 2014-09-28 NOTE — ED Provider Notes (Signed)
CSN: [id number]     Arrival date & time 09/28/14  [id number] History   First MD Initiated Contact with Patient 09/28/14 [phone]     Chief Complaint  Patient presents with  . Ankle Pain     (Consider location/radiation/quality/duration/timing/severity/associated sxs/prior Treatment) HPI  Patient [place]. He reports chronic discomfort in his ankles. He states he is unable to stand more than 20 minutes without having to sit down. He states for the past month he has felt like there is a pin or some other piece of hardware that is protruding under the skin of his right ankle. He states today about 520 he was getting up out of bed and when he stepped on his right ankle it gave out and he fell down. He states this has happened once before. He states the limits on the door was open and he was able to grab it to help his fall and he did not hit his head or have loss of consciousness. He complains of pain in his right 5 and his right ankle. He states after he fell he was unable to get up. Patient has chronic pain in his ankle. He has been prescribed oxycodone by his PCP, however they have told him he needs to start going to a pain management clinic, however he states he can't pay the initial $100 to be seen.   PCP [facility]  Past Medical History  Diagnosis Date  . Traumatic brain injury    Past Surgical History  Procedure Laterality Date  . Fracture surgery      bilateral ankles and plates in pelvis and rods  to right  forearm   Family History  Problem Relation Age of Onset  . Diabetes Mother   . Hypertension Mother   . Hypertension Father   . Diabetes Father   . Cancer Other    History  Substance Use Topics  . Smoking status: Current Every Day Smoker -- 0.50 packs/day for 10 years    Types: Cigarettes  . Smokeless tobacco: Not on file  . Alcohol Use: 2.4 oz/week    2 Cans of beer,  2 Shots of liquor per week  unemployed, applying for disability Does not live alone  Review of Systems  All other systems reviewed and are negative.     Allergies  Pork-derived products  Home Medications   Prior to Admission medications   Medication Sig Start Date End Date Taking? Authorizing Provider  oxyCODONE-acetaminophen (PERCOCET) 10-325 MG per tablet Take 1 tablet by mouth every 4 (four) hours as needed for pain.   Yes Historical Provider, MD  PRESCRIPTION MEDICATION Take 1 tablet by mouth 2 (two) times daily. Yellow tablet with black letters. Anti inflammatory   Yes Historical Provider, MD  promethazine (PHENERGAN) 25 MG tablet Take 1 tablet (25 mg total) by mouth every 6 (six) hours as needed for nausea or vomiting. 06/21/14  Yes Mora BellmanHannah S Merrell, PA-C  oxyCODONE-acetaminophen (PERCOCET/ROXICET) 5-325 MG per tablet Take 1-2 tablets by mouth every 6 (six) hours as needed for moderate pain or severe pain. 06/21/14   Mora BellmanHannah S Merrell, PA-C  traMADol (ULTRAM) 50 MG tablet Take 100 mg by mouth 4 (four) times daily.    Historical Provider, MD   BP 127/83 mmHg  Pulse 72  Temp(Src) 98.9 F (37.2 C) (Oral)  Resp 20  Ht 6\' 2"  (1.88 m)  Wt 185 lb (83.915 kg)  BMI 23.74 kg/m2  SpO2 98%  Vital signs normal   Physical Exam  Constitutional: He is oriented to person, place, and time. He appears well-developed and well-nourished.  Non-toxic appearance. He does not appear ill. No distress.  HENT:  Head: Normocephalic and atraumatic.  Right Ear: External ear normal.  Left Ear: External ear normal.  Nose: Nose normal. No mucosal edema or rhinorrhea.  Mouth/Throat: Oropharynx is clear and moist and mucous membranes are normal. No dental abscesses or uvula swelling.  Eyes: Conjunctivae and EOM are normal. Pupils are equal, round, and reactive to light.  Neck: Normal range of motion and full passive range of motion without pain. Neck supple.  Pulmonary/Chest: Effort normal. No respiratory  distress. He has no rhonchi. He exhibits no crepitus.  Abdominal: Normal appearance.  Musculoskeletal: Normal range of motion. He exhibits tenderness. He exhibits no edema.  Pt has no tenderness in his left hip medially or laterally. He has diffuse tenderness in his right thigh muscles. Right knee is nontender and without effusion or abrasions. He has a scar over his lateral right ankle, with some tenting of his skin anteriorly and laterally (see photo). No obvious deformity, no tenderness of his foot. Pulses intact.   Neurological: He is alert and oriented to person, place, and time. He has normal strength. No cranial nerve deficit.  Skin: Skin is warm, dry and intact. No rash noted. No erythema. No pallor.  Psychiatric: He has a normal mood and affect. His speech is normal and behavior is normal. His mood appears not anxious.  Nursing note and vitals reviewed.     ED Course  Procedures (including critical care time)  Medications  HYDROcodone-acetaminophen (NORCO/VICODIN) 5-325 MG per tablet 2 tablet (2 tablets Oral Given 09/28/14 0636)  ketorolac (TORADOL) 30 MG/ML injection 30 mg (30 mg Intravenous Given 09/28/14 0637)  ketorolac (TORADOL) 30 MG/ML injection 30 mg (30 mg Intravenous Given 09/28/14 0740)   [facility] shows patient has only had 2 narcotic prescriptions in the past 6 months, on July 6 he received #60 tramadol 50 mg tablets, and on August 6 he received #30 oxycodone 5/325. 1 prescription was from our ER, [place].  Review of prior visits shows patient was admitted in November after having had 2 seizures. He had a positive drug screen for cocaine, amphetamines, and marijuana. He had a normal EEG. It was felt his seizures were from his polysubstance abuse.  Pt given crutches to use.   08:31 Durward Mallardamille, case manager will come talk to patient about getting an orange card  Labs Review Labs Reviewed - No data to  display  Imaging Review Dg Tibia/fibula Right  09/28/2014   CLINICAL DATA:  Right ankle pain, no new injury  EXAM: RIGHT TIBIA AND FIBULA - 2 VIEW  COMPARISON:  None.  FINDINGS: Old/healed proximal fibular fracture.  No evidence of acute fracture or dislocation.  Old deformity of the distal tibia/ fibula with associated postsurgical changes are described on dedicated ankle radiographs.  IMPRESSION: Old/ healed proximal fibular fracture.  Postsurgical changes with posttraumatic deformity involving the ankle.  No evidence of acute fracture or dislocation.   Electronically Signed   By: Charline BillsSriyesh  Krishnan M.D.   On: 09/28/2014 07:11   Dg Ankle Complete Right  09/28/2014   CLINICAL DATA:  Right ankle pain, no known injury  EXAM: RIGHT ANKLE - COMPLETE 3+ VIEW  COMPARISON:  06/21/2014  FINDINGS: Posterior compression plate and screw fixation of the distal tibia.  Lateral compression plate and screw fixation of the medial malleolus.  Additional screws transfixing the talus.  Additional distal syndesmotic screw.  No evidence of acute fracture or dislocation.  Posttraumatic tibiotalar degenerative changes.  IMPRESSION: No evidence of acute fracture or dislocation.  Postsurgical changes, as above.   Electronically Signed   By: Charline Bills M.D.   On: 09/28/2014 07:10     EKG Interpretation None      MDM  Patient has chronic pain and has had some tenting from a piece of his hardware in his right ankle for about a month patient will need referral to orthopedics to decide if this needs to be removed. Patient also has chronic pain. He also has a history of polysubstance abuse with seizure with normal EEG. I am not going to prescribe tramadol because of that. I am also not to prescribe any chronic pain medication. Patient told me his PCP was writing for his chronic pain medications however according to the database he does not get any pain medication on a regular basis.   Final diagnoses:  Fall, initial  encounter  Chronic ankle pain, right  Chronic ankle pain, left    New Prescriptions   NAPROXEN (NAPROSYN) 500 MG TABLET    Take 1 tablet (500 mg total) by mouth 2 (two) times daily.    Plan discharge  Devoria Albe, MD, Franz Dell, MD 09/28/14 [phone]

## 2014-09-28 NOTE — ED Notes (Signed)
Patient states he was getting out of the bed, was not able to take even one step and when he put weight on the right foot his foot gave away.  Patient has a "new" palpable screw to the top of the right ankle with pain 8/10.

## 2014-09-28 NOTE — ED Notes (Signed)
Patient alert and oriented at discharge.  Pt given crutches to use at home and advised by this RN to follow up with pain management and orthopedic as well as other providers after discharge.  Patient taken to the waiting room in a wheelchair to wait for family.

## 2014-10-22 ENCOUNTER — Ambulatory Visit: Payer: Self-pay

## 2015-06-11 ENCOUNTER — Emergency Department (HOSPITAL_COMMUNITY): Payer: Self-pay

## 2015-06-11 ENCOUNTER — Emergency Department (HOSPITAL_COMMUNITY)
Admission: EM | Admit: 2015-06-11 | Discharge: 2015-06-11 | Disposition: A | Discharge status: Home / Self Care | Payer: Self-pay | Attending: Physician Assistant | Admitting: Physician Assistant

## 2015-06-11 DIAGNOSIS — Z87820 Personal history of traumatic brain injury: Secondary | ICD-10-CM | POA: Insufficient documentation

## 2015-06-11 DIAGNOSIS — Z720 Tobacco use: Secondary | ICD-10-CM | POA: Insufficient documentation

## 2015-06-11 DIAGNOSIS — F419 Anxiety disorder, unspecified: Secondary | ICD-10-CM | POA: Insufficient documentation

## 2015-06-11 LAB — BASIC METABOLIC PANEL
ANION GAP: 6 (ref 5–15)
BUN: 15 mg/dL (ref 6–20)
CALCIUM: 8.7 mg/dL — AB (ref 8.9–10.3)
CO2: 26 mmol/L (ref 22–32)
Chloride: 106 mmol/L (ref 101–111)
Creatinine, Ser: 1.17 mg/dL (ref 0.61–1.24)
GFR calc Af Amer: >60 mL/min (ref >60)
GFR calc non Af Amer: >60 mL/min (ref >60)
Glucose, Bld: 81 mg/dL (ref 65–99)
Potassium: 5.5 mmol/L — ABNORMAL HIGH (ref 3.5–5.1)
Sodium: 138 mmol/L (ref 135–145)

## 2015-06-11 LAB — CBC WITH DIFFERENTIAL/PLATELET
BASOS PCT: 0 % (ref 0–1)
Basophils Absolute: 0 10*3/uL (ref 0.0–0.1)
Eosinophils Absolute: 0.2 10*3/uL (ref 0.0–0.7)
Eosinophils Relative: 2 % (ref 0–5)
HCT: 45.3 % (ref 39.0–52.0)
Hemoglobin: 14.8 g/dL (ref 13.0–17.0)
LYMPHS ABS: 1.6 10*3/uL (ref 0.7–4.0)
Lymphocytes Relative: 22 % (ref 12–46)
MCH: 26.2 pg (ref 26.0–34.0)
MCHC: 32.7 g/dL (ref 30.0–36.0)
MCV: 80.2 fL (ref 78.0–100.0)
MONO ABS: 0.8 10*3/uL (ref 0.1–1.0)
Monocytes Relative: 11 % (ref 3–12)
NEUTROS ABS: 4.7 10*3/uL (ref 1.7–7.7)
Neutrophils Relative %: 65 % (ref 43–77)
Platelets: 403 10*3/uL — ABNORMAL HIGH (ref 150–400)
RBC: 5.65 MIL/uL (ref 4.22–5.81)
RDW: 14.3 % (ref 11.5–15.5)
WBC: 7.3 10*3/uL (ref 4.0–10.5)

## 2015-06-11 LAB — I-STAT TROPONIN, ED: Troponin i, poc: 0 ng/mL (ref 0.00–0.08)

## 2015-06-11 MED ORDER — SODIUM CHLORIDE 0.9 % IV BOLUS (SEPSIS)
1000.0000 mL | Freq: Once | INTRAVENOUS | Status: AC
  Administered 2015-06-11: 1000 mL via INTRAVENOUS

## 2015-06-11 MED ORDER — LORAZEPAM 0.5 MG PO TABS
0.5000 mg | ORAL_TABLET | Freq: Once | ORAL | Status: AC
  Administered 2015-06-11: 0.5 mg via ORAL
  Filled 2015-06-11: qty 1

## 2015-06-11 NOTE — ED Notes (Signed)
Pt c/o generalized chest pain and SHOB since this AM.  Pt states pain is worse with breathing, movement, and palpation.  Pt is hyperventilating during assessment and c/o tingling in hands.  Pt encouraged to slow breathing down.  Pt denies nausea, vomiting, diaphoresis.
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
## 2015-06-11 NOTE — Discharge Instructions (Signed)
Pklease return immediately with any new chest pain towards the breath or other concerns.  Panic Attacks Panic attacks are sudden, short feelings of great fear or discomfort. You may have them for no reason when you are relaxed, when you are uneasy (anxious), or when you are sleeping.  HOME CARE  Take all your medicines as told.  Check with your doctor before starting new medicines.  Keep all doctor visits. GET HELP IF:  You are not able to take your medicines as told.  Your symptoms do not get better.  Your symptoms get worse. GET HELP RIGHT AWAY IF:  Your attacks seem different than your normal attacks.  You have thoughts about hurting yourself or others.  You take panic attack medicine and you have a side effect. MAKE SURE YOU:  Understand these instructions.  Will watch your condition.  Will get help right away if you are not doing well or get worse. Document Released: 12/05/2010 Document Revised: 08/23/2013 Document Reviewed: 06/16/2013 [facility] Patient Information 2015 [place], [place]. This information is not intended to replace advice given to you by your health care provider. Make sure you discuss any questions you have with your health care provider.

## 2015-06-11 NOTE — ED Notes (Signed)
Verbalized understanding discharge instructions. In no acute distress.   
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
## 2015-06-11 NOTE — ED Notes (Signed)
Bed: WU98 Expected date:  Expected time:  Means of arrival:  Comments: EMS- 33yo M, chest pain/anxiety

## 2015-06-11 NOTE — ED Notes (Signed)
Patient transported to X-ray 

## 2015-07-29 ENCOUNTER — Encounter (HOSPITAL_COMMUNITY): Payer: Self-pay | Admitting: Family Medicine

## 2015-07-29 ENCOUNTER — Emergency Department (HOSPITAL_COMMUNITY)
Admission: EM | Admit: 2015-07-29 | Discharge: 2015-07-29 | Payer: Self-pay | Attending: Emergency Medicine | Admitting: Emergency Medicine

## 2015-07-29 ENCOUNTER — Emergency Department (HOSPITAL_COMMUNITY): Payer: Self-pay

## 2015-07-29 DIAGNOSIS — Y9289 Other specified places as the place of occurrence of the external cause: Secondary | ICD-10-CM | POA: Insufficient documentation

## 2015-07-29 DIAGNOSIS — Y998 Other external cause status: Secondary | ICD-10-CM | POA: Insufficient documentation

## 2015-07-29 DIAGNOSIS — W2209XA Striking against other stationary object, initial encounter: Secondary | ICD-10-CM | POA: Insufficient documentation

## 2015-07-29 DIAGNOSIS — Y9389 Activity, other specified: Secondary | ICD-10-CM | POA: Insufficient documentation

## 2015-07-29 DIAGNOSIS — Z720 Tobacco use: Secondary | ICD-10-CM | POA: Insufficient documentation

## 2015-07-29 DIAGNOSIS — Z9889 Other specified postprocedural states: Secondary | ICD-10-CM | POA: Insufficient documentation

## 2015-07-29 DIAGNOSIS — T1490XA Injury, unspecified, initial encounter: Secondary | ICD-10-CM

## 2015-07-29 DIAGNOSIS — S99912A Unspecified injury of left ankle, initial encounter: Secondary | ICD-10-CM | POA: Insufficient documentation

## 2015-07-29 DIAGNOSIS — M25572 Pain in left ankle and joints of left foot: Secondary | ICD-10-CM

## 2015-07-29 MED ORDER — OXYCODONE-ACETAMINOPHEN 5-325 MG PO TABS
2.0000 | ORAL_TABLET | Freq: Once | ORAL | Status: AC
  Administered 2015-07-29: 2 via ORAL
  Filled 2015-07-29: qty 2
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
## 2015-07-29 NOTE — Discharge Instructions (Signed)
Take your prescribed medications for pain. Do not put any weight on your left leg. Use crutches when walking. Follow-up with your orthopedist as soon as you are able for recheck.  RICE: Routine Care for Injuries The routine care of many injuries includes Rest, Ice, Compression, and Elevation (RICE). HOME CARE INSTRUCTIONS  Rest is needed to allow your body to heal. Routine activities can usually be resumed when comfortable. Injured tendons and bones can take up to 6 weeks to heal. Tendons are the cord-like structures that attach muscle to bone.  Ice following an injury helps keep the swelling down and reduces pain.  Put ice in a plastic bag.  Place a towel between your skin and the bag.  Leave the ice on for 15-20 minutes, 3-4 times a day, or as directed by your health care provider. Do this while awake, for the first 24 to 48 hours. After that, continue as directed by your caregiver.  Compression helps keep swelling down. It also gives support and helps with discomfort. If an elastic bandage has been applied, it should be removed and reapplied every 3 to 4 hours. It should not be applied tightly, but firmly enough to keep swelling down. Watch fingers or toes for swelling, bluish discoloration, coldness, numbness, or excessive pain. If any of these problems occur, remove the bandage and reapply loosely. Contact your caregiver if these problems continue.  Elevation helps reduce swelling and decreases pain. With extremities, such as the arms, hands, legs, and feet, the injured area should be placed near or above the level of the heart, if possible. SEEK IMMEDIATE MEDICAL CARE IF:  You have persistent pain and swelling.  You develop redness, numbness, or unexpected weakness.  Your symptoms are getting worse rather than improving after several days. These symptoms may indicate that further evaluation or further X-rays are needed. Sometimes, X-rays may not show a small broken bone (fracture)  until 1 week or 10 days later. Make a follow-up appointment with your caregiver. Ask when your X-ray results will be ready. Make sure you get your X-ray results. Document Released: 02/14/2001 Document Revised: 11/07/2013 Document Reviewed: 04/03/2011 [facility] Patient Information 2015 [place], [place]. This information is not intended to replace advice given to you by your health care provider. Make sure you discuss any questions you have with your health care provider.
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
## 2016-05-19 IMAGING — CR DG CHEST 2V
2 series · 2 of 2 positions shown · non-contrast
Comparison: 02/28/2013

CLINICAL DATA: Left-sided chest pain and shortness of Breath

EXAM:
CHEST - 2 VIEW

[w chest pa]
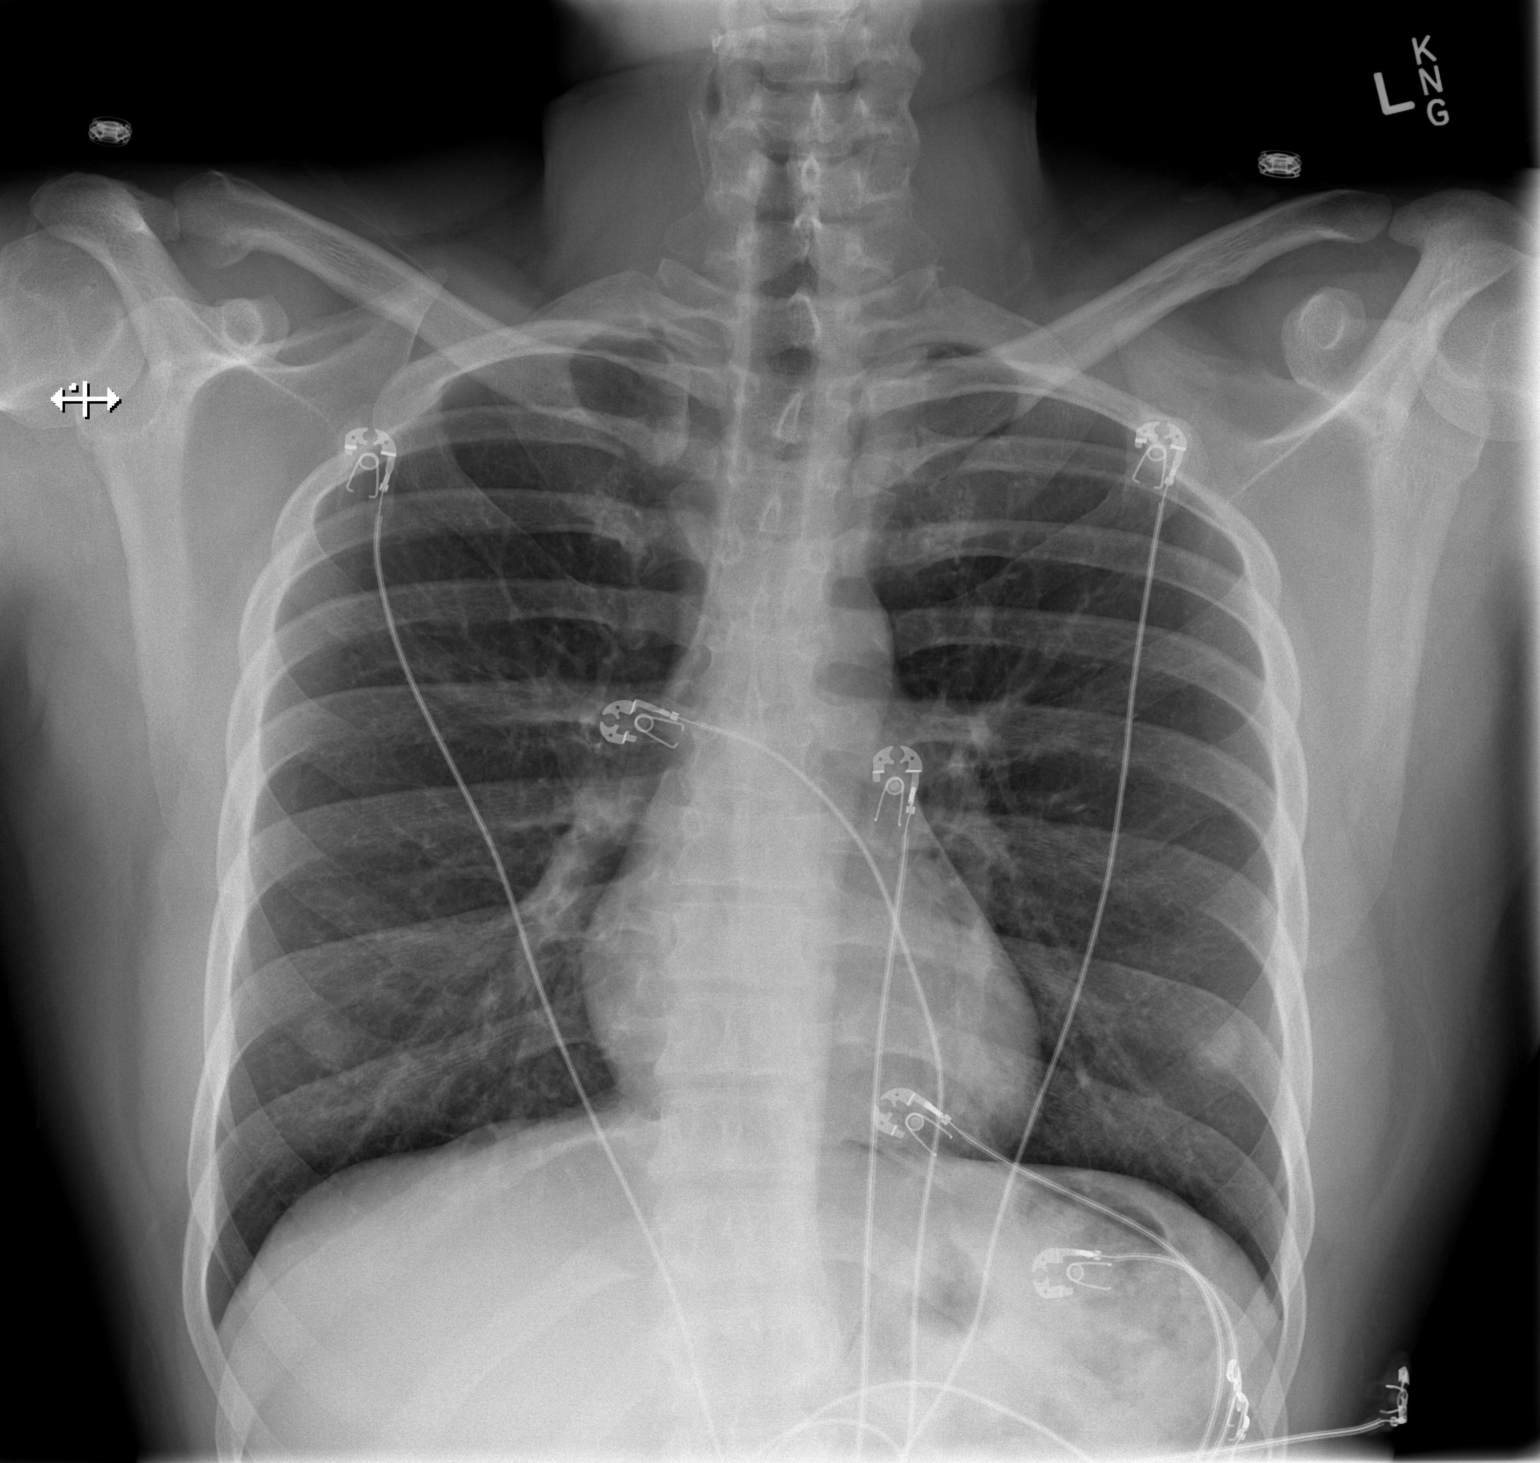

[w chest lat]
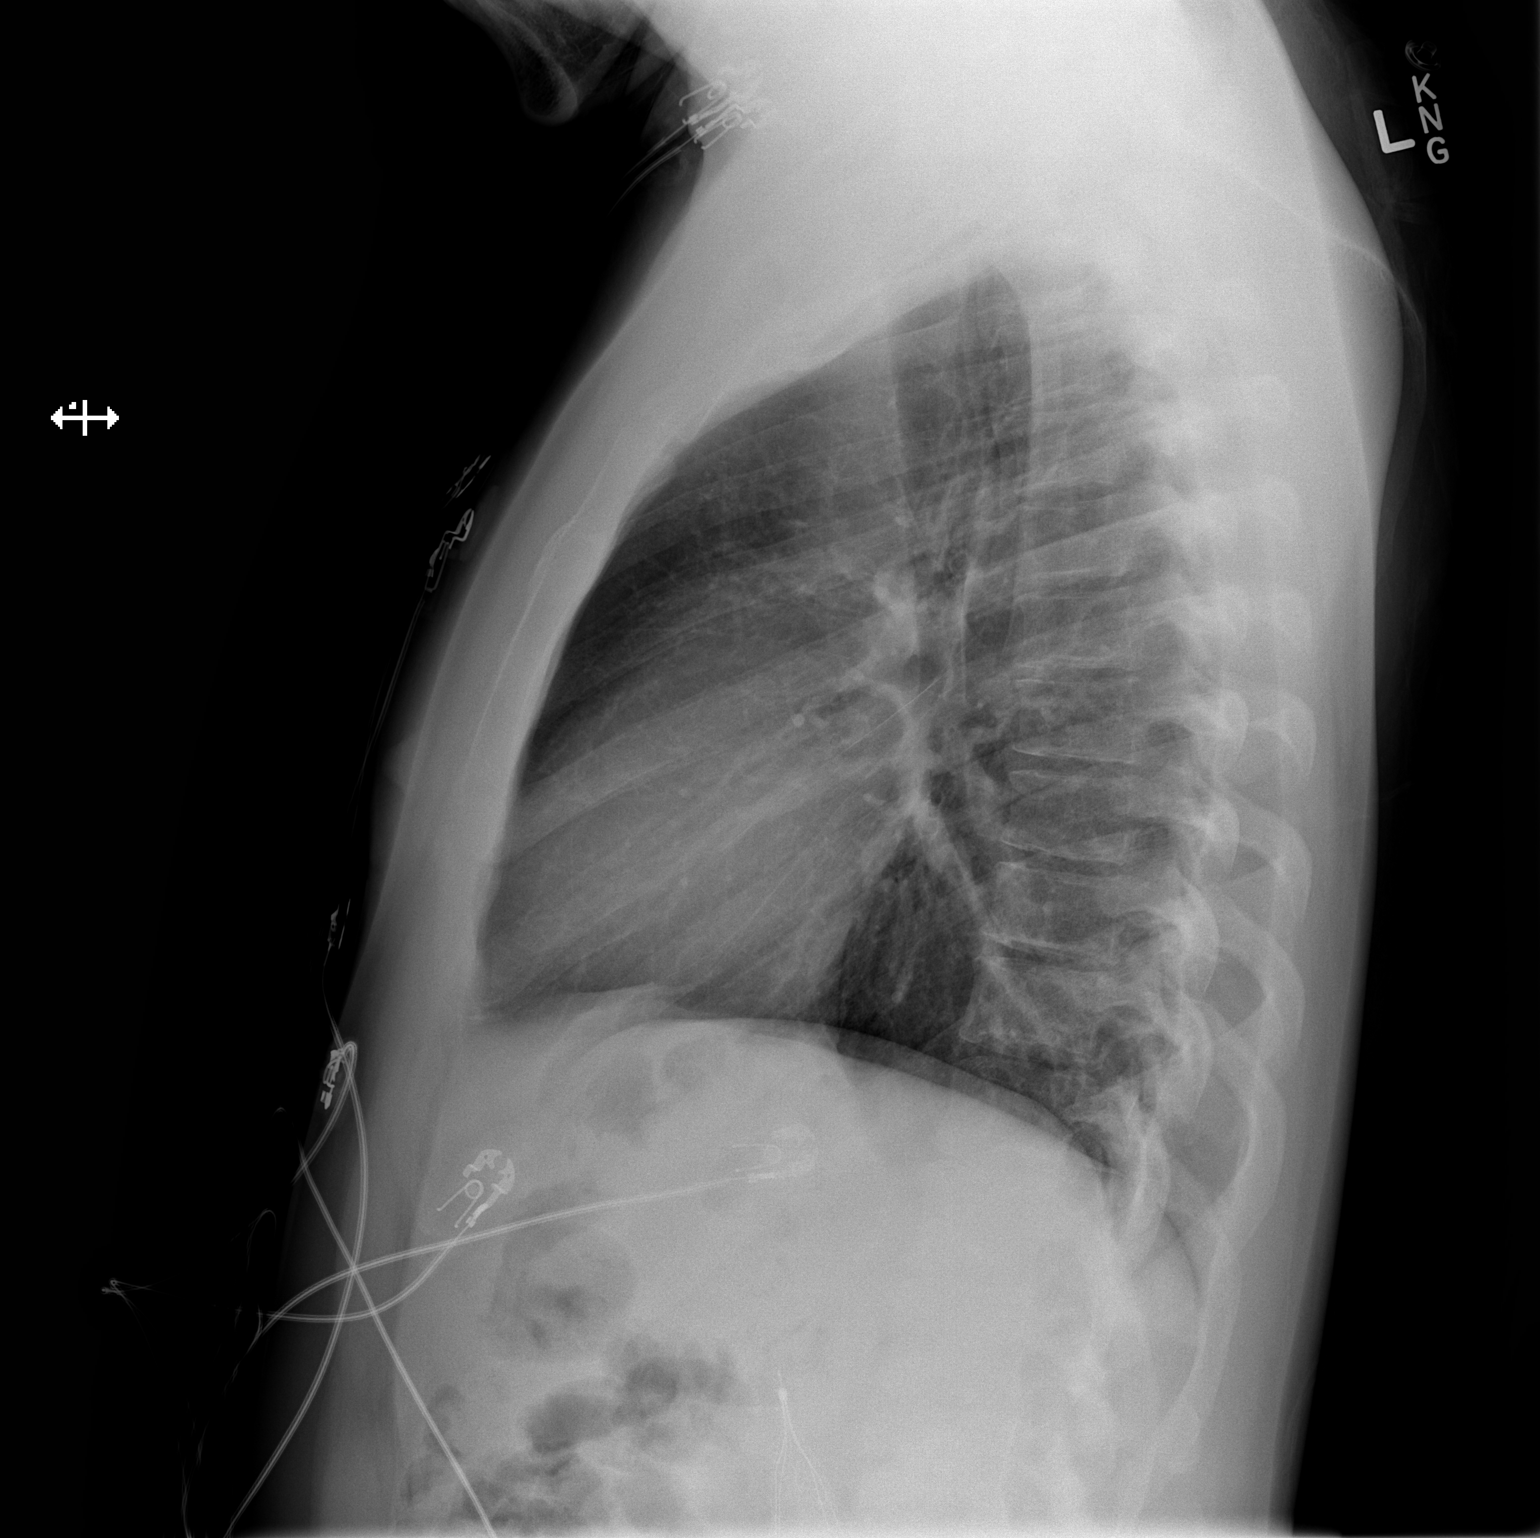

[2 of 2 positions shown; findings below may reference images not displayed]

FINDINGS: The heart size and mediastinal contours are within normal limits.
Both lungs are clear. The visualized skeletal structures are
unremarkable. Bilateral nipple shadows are noted. IVC filter is
noted.
IMPRESSION: No active disease.

## 2016-07-06 IMAGING — CR DG ANKLE COMPLETE 3+V*L*
3 series · 3 of 3 positions shown · non-contrast
Comparison: 06/21/2014

CLINICAL DATA: Left ankle pain after injury. Recent ankle fusion
July 18, 2015. Injury after that time.

EXAM:
LEFT ANKLE COMPLETE - 3+ VIEW

[x ankle ap left]
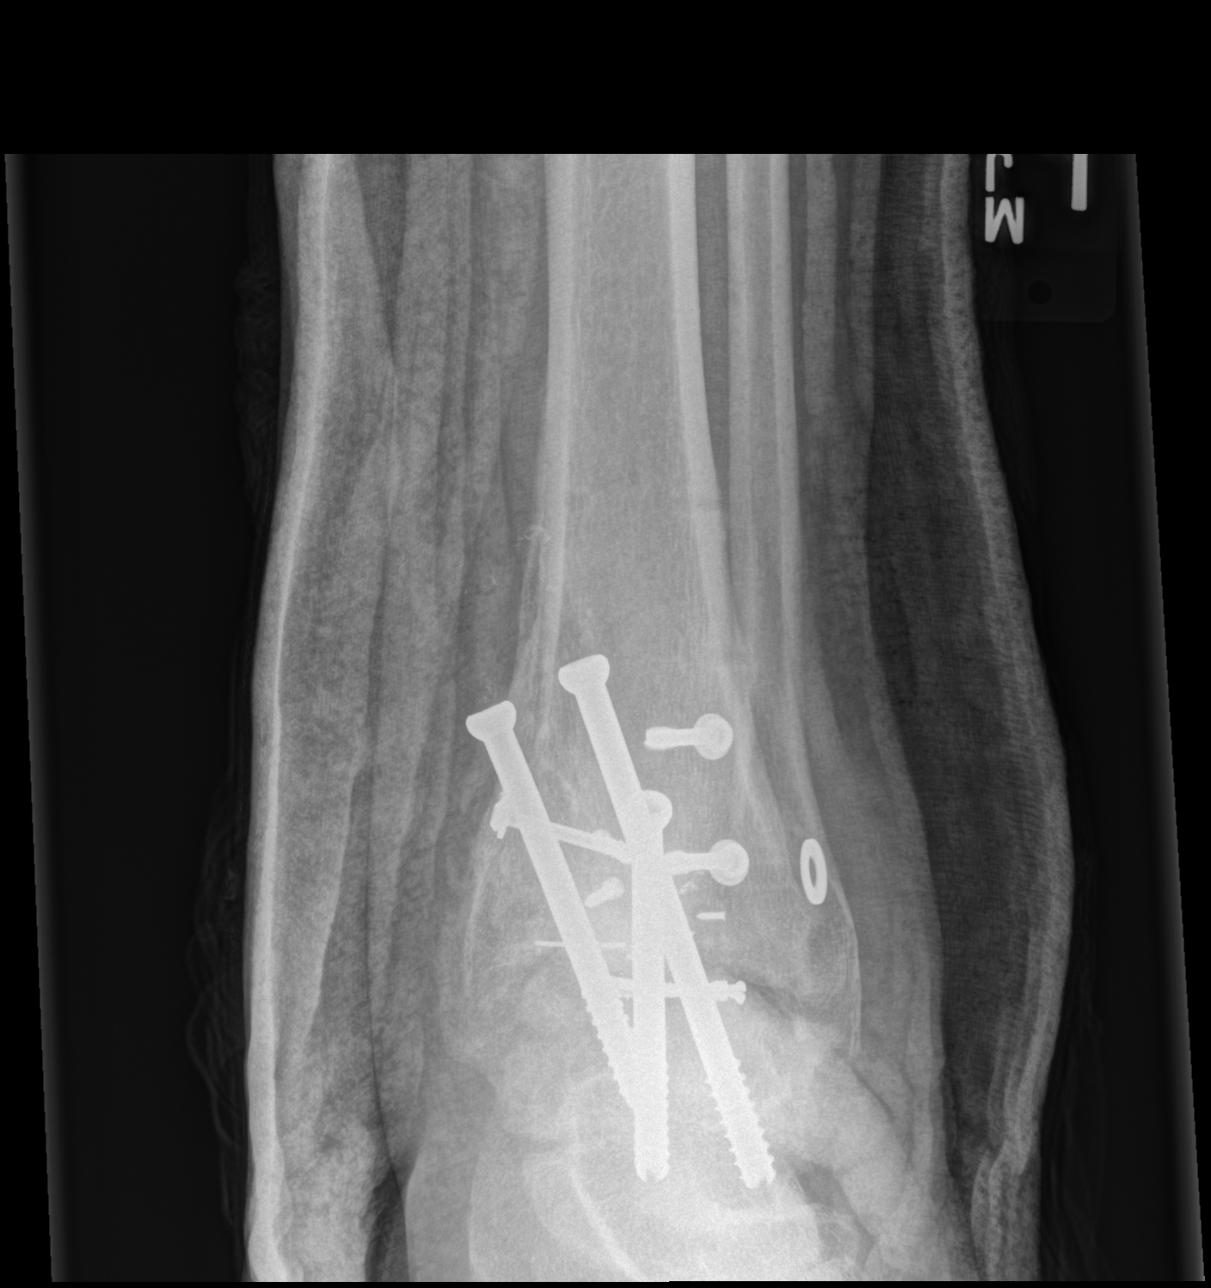

[x ankle lat left (1 of 2)]
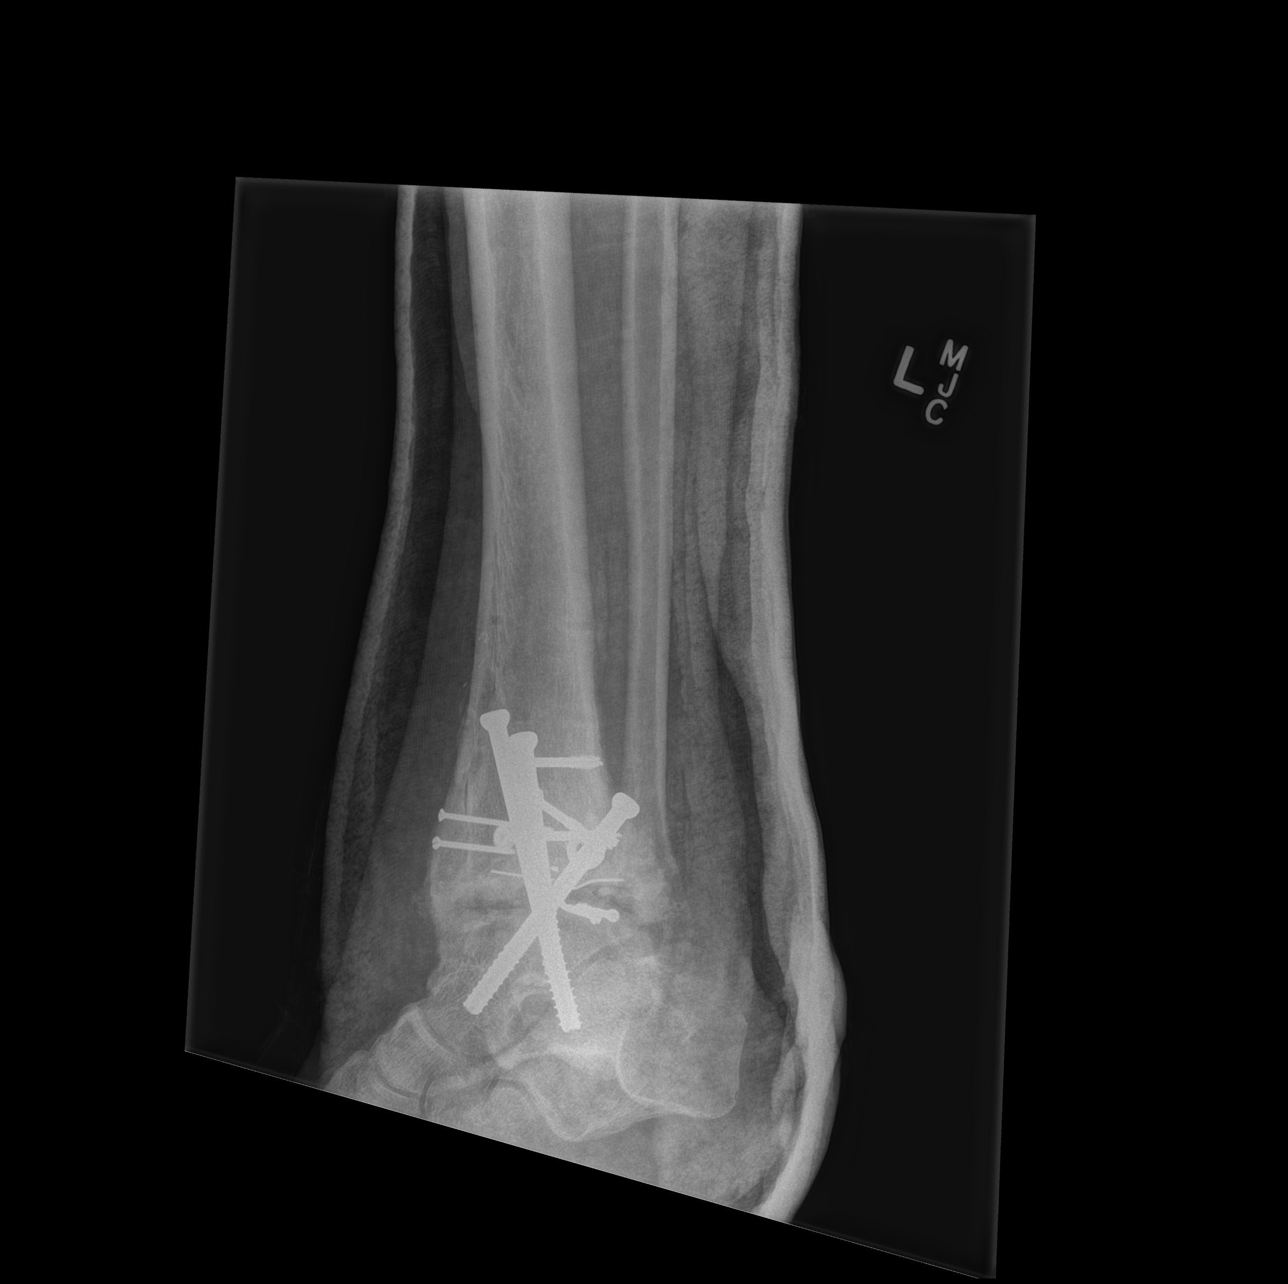

[x ankle lat left (2 of 2)]
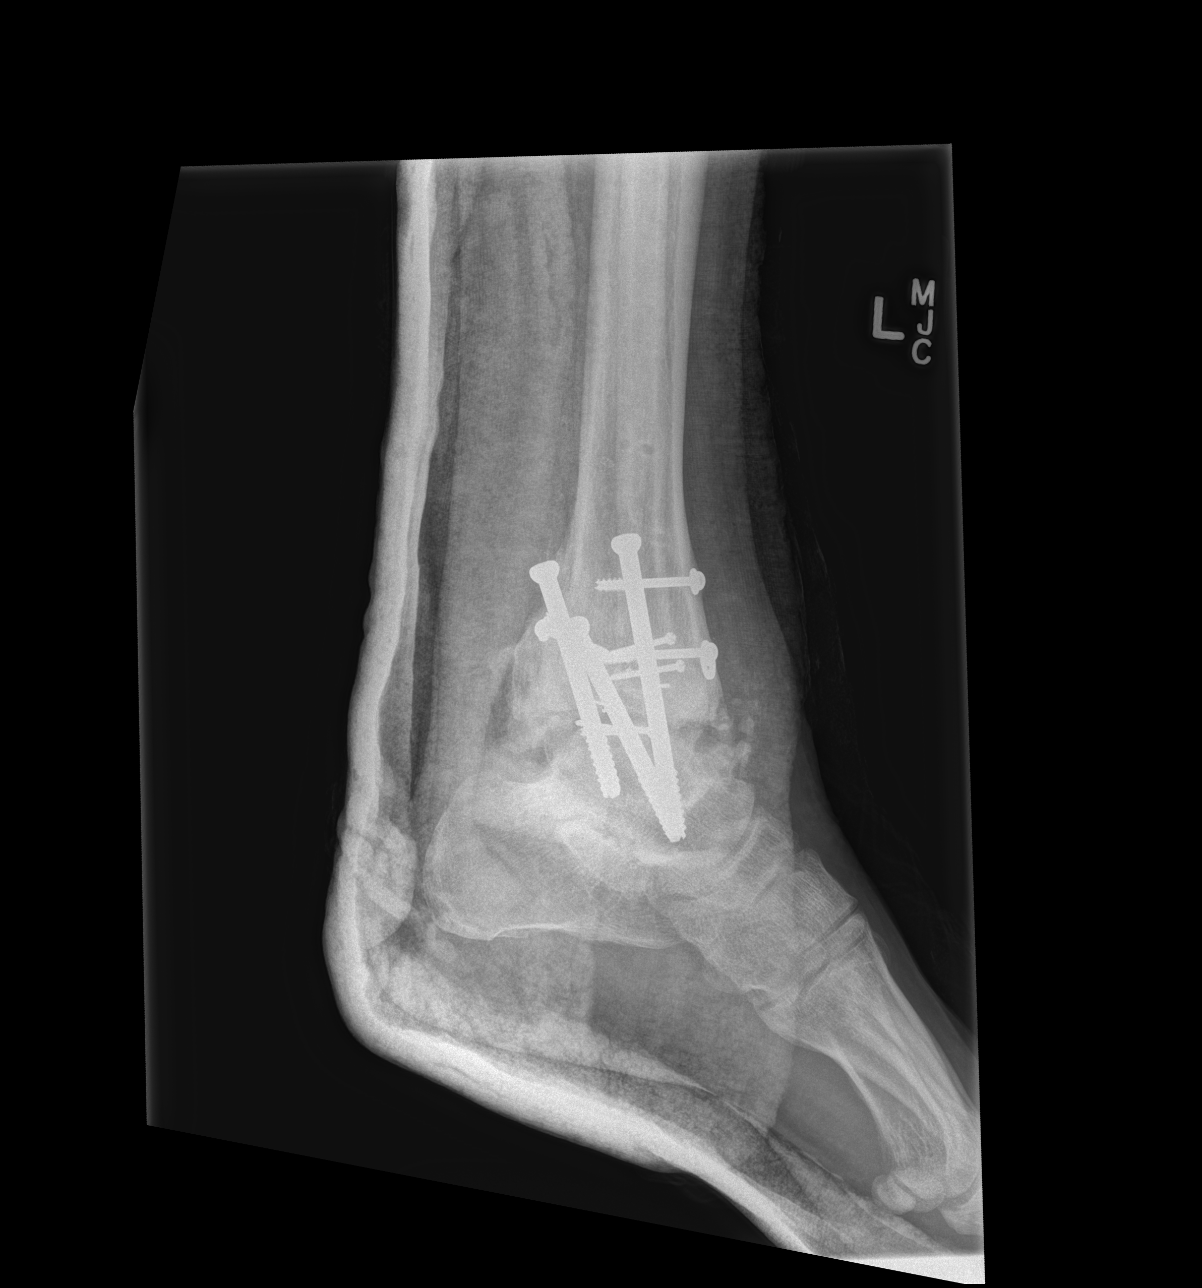

[3 of 3 positions shown; findings below may reference images not displayed]

FINDINGS: Imaging obtained through cast material limiting osseous and soft
tissue fine detail. Three partially cannulated screws traverse the
tibial talar joint. Additional hardware in the distal tibia and
fibula again seen. Removal of previous medial plate and screws with
ghost tracks. No grossly displaced acute fracture.
IMPRESSION: Postsurgical change with overlying cast material in place. Allowing
for this, no grossly displaced acute bony abnormality is seen.

## 2020-09-03 ENCOUNTER — Encounter: Payer: Self-pay | Admitting: Internal Medicine

## 2020-09-03 ENCOUNTER — Telehealth (INDEPENDENT_AMBULATORY_CARE_PROVIDER_SITE_OTHER): Payer: Medicaid Other | Admitting: Internal Medicine

## 2020-09-03 DIAGNOSIS — Z7689 Persons encountering health services in other specified circumstances: Secondary | ICD-10-CM

## 2020-09-03 DIAGNOSIS — R7303 Prediabetes: Secondary | ICD-10-CM
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
## 2020-09-03 NOTE — Patient Instructions (Signed)
Thank you for choosing Primary Care at [facility] to be your medical home!    Homero Hyson was seen by De Hollingshead, DO today.   Thermon Leyland Wiatrek's primary care provider is Marcy Siren, DO.   For the best care possible, you should try to see Marcy Siren, DO whenever you come to the clinic.   We look forward to seeing you again soon!  If you have any questions about your visit today, please call us at [phone] or feel free to reach your primary care provider via MyChart.

## 2020-09-11 ENCOUNTER — Ambulatory Visit: Payer: Medicaid Other

## 2020-09-11 DIAGNOSIS — E118 Type 2 diabetes mellitus with unspecified complications: Secondary | ICD-10-CM

## 2020-09-19 ENCOUNTER — Ambulatory Visit: Payer: Self-pay | Admitting: Internal Medicine

## 2021-12-29 ENCOUNTER — Encounter (HOSPITAL_COMMUNITY): Payer: Self-pay | Admitting: *Deleted

## 2021-12-29 ENCOUNTER — Other Ambulatory Visit: Payer: Self-pay

## 2021-12-29 ENCOUNTER — Emergency Department (HOSPITAL_COMMUNITY)
Admission: EM | Admit: 2021-12-29 | Discharge: 2021-12-29 | Discharge status: Against Medical Advice | Payer: Medicaid Other | Attending: Emergency Medicine | Admitting: Emergency Medicine

## 2021-12-29 DIAGNOSIS — M79672 Pain in left foot: Secondary | ICD-10-CM | POA: Insufficient documentation

## 2021-12-29 DIAGNOSIS — W010XXA Fall on same level from slipping, tripping and stumbling without subsequent striking against object, initial encounter: Secondary | ICD-10-CM | POA: Diagnosis not present

## 2021-12-29 DIAGNOSIS — Z5321 Procedure and treatment not carried out due to patient leaving prior to being seen by health care provider: Secondary | ICD-10-CM | POA: Diagnosis not present

## 2021-12-29 DIAGNOSIS — M545 Low back pain, unspecified: Secondary | ICD-10-CM | POA: Diagnosis not present

## 2021-12-29 NOTE — ED Triage Notes (Signed)
Pt slipped and fell at [facility] yesterday, low back pain, Left foot pain buttocks pain. Pt has been able to ambulate.

## 2022-07-19 ENCOUNTER — Encounter (HOSPITAL_COMMUNITY): Payer: Self-pay | Admitting: Emergency Medicine

## 2022-07-19 ENCOUNTER — Telehealth (HOSPITAL_COMMUNITY): Payer: Self-pay | Admitting: Emergency Medicine

## 2022-07-19 ENCOUNTER — Emergency Department (HOSPITAL_COMMUNITY)
Admission: EM | Admit: 2022-07-19 | Discharge: 2022-07-19 | Disposition: A | Discharge status: Home / Self Care | Payer: Medicaid Other | Attending: Emergency Medicine | Admitting: Emergency Medicine

## 2022-07-19 ENCOUNTER — Other Ambulatory Visit: Payer: Self-pay

## 2022-07-19 DIAGNOSIS — K029 Dental caries, unspecified: Secondary | ICD-10-CM | POA: Insufficient documentation

## 2022-07-19 DIAGNOSIS — K0889 Other specified disorders of teeth and supporting structures: Secondary | ICD-10-CM | POA: Diagnosis present

## 2022-07-19 MED ORDER — IBUPROFEN 800 MG PO TABS
800.0000 mg | ORAL_TABLET | Freq: Once | ORAL | Status: AC
  Administered 2022-07-19: 800 mg via ORAL
  Filled 2022-07-19: qty 1

## 2022-07-19 MED ORDER — LIDOCAINE VISCOUS HCL 2 % MT SOLN
15.0000 mL | Freq: Once | OROMUCOSAL | Status: AC
  Administered 2022-07-19: 15 mL via OROMUCOSAL
  Filled 2022-07-19: qty 15

## 2022-07-19 MED ORDER — AMOXICILLIN 500 MG PO CAPS: 500.0000 mg | ORAL_CAPSULE | Freq: Three times a day (TID) | ORAL | 0 refills | Status: DC

## 2022-07-19 MED ORDER — HYDROCODONE-ACETAMINOPHEN 5-325 MG PO TABS: 1.0000 | ORAL_TABLET | Freq: Four times a day (QID) | ORAL | 0 refills | Status: DC | PRN

## 2022-07-19 MED ORDER — ACETAMINOPHEN 500 MG PO TABS: 500.0000 mg | ORAL_TABLET | Freq: Four times a day (QID) | ORAL | 0 refills | Status: DC | PRN

## 2022-07-19 MED ORDER — LIDOCAINE VISCOUS HCL 2 % MT SOLN: 15.0000 mL | OROMUCOSAL | 0 refills | Status: AC | PRN

## 2022-07-19 MED ORDER — IBUPROFEN 800 MG PO TABS: 800.0000 mg | ORAL_TABLET | Freq: Three times a day (TID) | ORAL | 0 refills | Status: DC

## 2022-07-19 MED ORDER — LIDOCAINE VISCOUS HCL 2 % MT SOLN: 15.0000 mL | OROMUCOSAL | 0 refills | Status: DC | PRN

## 2022-07-19 MED ORDER — AMOXICILLIN 500 MG PO CAPS
500.0000 mg | ORAL_CAPSULE | Freq: Once | ORAL | Status: AC
  Administered 2022-07-19: 500 mg via ORAL
  Filled 2022-07-19: qty 1

## 2022-07-19 NOTE — Discharge Instructions (Signed)
Keep your appointment with your dentist.  I have sent the medications to the pharmacy.  You have been given your first dose of amoxicillin today.  This is 3 times a day to take your next 1 tonight.  I also sent extra strength Tylenol and ibuprofen to your pharmacy.  Additionally, the lidocaine solution may be used for pain.  Lastly I have sent hydrocodone to your pharmacy.  Only 5 tablets to use this only for severe pain.  Do not drive on it.  Take it at nighttime to help you sleep if needed.  Return with any fevers, chills, difficulty breathing or swallowing.  It was a pleasure to meet you and I hope you feel better!

## 2022-07-19 NOTE — Telephone Encounter (Signed)
Patient seen earlier, given viscous lidocaine. Pharmacy rejected Rx due to directions. Sent again with clearer instructions.

## 2022-07-19 NOTE — ED Provider Notes (Signed)
[facility]-EMERGENCY DEPT Provider Note   CSN: [id number] Arrival date & time: 07/19/22  1133     History  Chief Complaint  Patient presents with   Dental Pain    Rony Ratz is a 41 y.o. male complaining of bilateral lower dental pain.  Has an appointment with dentist this week but he cannot get it under control with 800 mg ibuprofen.  No fevers, chills or discharge.   Dental Pain      Home Medications Prior to Admission medications   Medication Sig Start Date End Date Taking? Authorizing Provider  acetaminophen (TYLENOL) 500 MG tablet Take 1 tablet (500 mg total) by mouth every 6 (six) hours as needed. 07/19/22  Yes Gracielynn Birkel A, PA-C  amoxicillin (AMOXIL) 500 MG capsule Take 1 capsule (500 mg total) by mouth 3 (three) times daily. 07/19/22  Yes Firmin Belisle A, PA-C  HYDROcodone-acetaminophen (NORCO/VICODIN) 5-325 MG tablet Take 1 tablet by mouth every 6 (six) hours as needed. 07/19/22  Yes Marimar Suber A, PA-C  ibuprofen (ADVIL) 800 MG tablet Take 1 tablet (800 mg total) by mouth 3 (three) times daily. 07/19/22  Yes Lurae Hornbrook A, PA-C  lidocaine (XYLOCAINE) 2 % solution Use as directed 15 mLs in the mouth or throat as needed for mouth pain. 07/19/22  Yes Deyra Perdomo A, PA-C      Allergies    Pork-derived products    Review of Systems   Review of Systems  Physical Exam Updated Vital Signs BP 130/86 (BP Location: Left Arm)   Pulse 86   Temp 99.3 F (37.4 C) (Oral)   Resp 18   SpO2 100%  Physical Exam Vitals and nursing note reviewed.  Constitutional:      Appearance: Normal appearance.  HENT:     Head: Normocephalic and atraumatic.     Mouth/Throat:     Mouth: Mucous membranes are moist.     Dentition: Dental tenderness and dental caries present.     Pharynx: Oropharynx is clear. Uvula midline. No oropharyngeal exudate.      Comments: Broken bilateral lower molars.  No bleeding.  No signs of abscess.  No signs of  PTA/RPA Eyes:     General: No scleral icterus.    Conjunctiva/sclera: Conjunctivae normal.  Pulmonary:     Effort: Pulmonary effort is normal. No respiratory distress.  Skin:    Findings: No rash.  Neurological:     Mental Status: He is alert.  Psychiatric:        Mood and Affect: Mood normal.     ED Results / Procedures / Treatments   Labs (all labs ordered are listed, but only abnormal results are displayed) Labs Reviewed - No data to display  EKG None  Radiology No results found.  Procedures Procedures   Medications Ordered in ED Medications  lidocaine (XYLOCAINE) 2 % viscous mouth solution 15 mL (has no administration in time range)  amoxicillin (AMOXIL) capsule 500 mg (has no administration in time range)  ibuprofen (ADVIL) tablet 800 mg (has no administration in time range)    ED Course/ Medical Decision Making/ A&P                           Medical Decision Making Risk OTC drugs. Prescription drug management.   41 year old male presenting with dental pain.  Is unable to make it to his dentist appointment secondary to the pain.  Physical exam with multiple broken teeth, no signs  of abscess.  Some associated gingivitis.  Treatment: Given ibuprofen and lidocaine today  MDM/disposition: This is a 41 year old male presenting with dental pain.  Has been proactive and has an appointment with his dentist later this week however he is unable to make it because of his pain.  Will send 800 mg ibuprofen, extra Tylenol, amoxicillin, lidocaine and 5 tablets of Norco.  He understands that he will not get any more through the emergency department.  He is agreeable to the plan.  Airway clear, tolerating secretions and stable for discharge.  Final Clinical Impression(s) / ED Diagnoses Final diagnoses:  Pain due to dental caries    Rx / DC Orders ED Discharge Orders          Ordered    lidocaine (XYLOCAINE) 2 % solution  As needed        07/19/22 1155     amoxicillin (AMOXIL) 500 MG capsule  3 times daily        07/19/22 1155    ibuprofen (ADVIL) 800 MG tablet  3 times daily        07/19/22 1155    acetaminophen (TYLENOL) 500 MG tablet  Every 6 hours PRN        07/19/22 1155    HYDROcodone-acetaminophen (NORCO/VICODIN) 5-325 MG tablet  Every 6 hours PRN        07/19/22 1158           Results and diagnoses were explained to the patient. Return precautions discussed in full. Patient had no additional questions and expressed complete understanding.   This chart was dictated using voice recognition software.  Despite best efforts to proofread,  errors can occur which can change the documentation meaning.    Woodroe Chen 07/19/22 1237    Arby Barrette, MD 07/19/22 1626

## 2022-07-19 NOTE — ED Triage Notes (Signed)
Pt reports dental pain on the left side. Reports he has an apt for the dentist in a week but he said the pain is unbearable.

## 2022-08-29 ENCOUNTER — Emergency Department (HOSPITAL_COMMUNITY)
Admission: EM | Admit: 2022-08-29 | Discharge: 2022-08-29 | Disposition: A | Discharge status: Home / Self Care | Payer: Medicaid Other | Attending: Emergency Medicine | Admitting: Emergency Medicine

## 2022-08-29 ENCOUNTER — Encounter (HOSPITAL_COMMUNITY): Payer: Self-pay

## 2022-08-29 ENCOUNTER — Other Ambulatory Visit: Payer: Self-pay

## 2022-08-29 DIAGNOSIS — K0889 Other specified disorders of teeth and supporting structures: Secondary | ICD-10-CM

## 2022-08-29 MED ORDER — HYDROMORPHONE HCL 1 MG/ML IJ SOLN
1.0000 mg | Freq: Once | INTRAMUSCULAR | Status: AC
  Administered 2022-08-29: 1 mg via INTRAMUSCULAR
  Filled 2022-08-29: qty 1

## 2022-08-29 MED ORDER — NAPROXEN 500 MG PO TABS: 500.0000 mg | ORAL_TABLET | Freq: Two times a day (BID) | ORAL | 0 refills | Status: DC | PRN

## 2022-08-29 MED ORDER — PENICILLIN V POTASSIUM 500 MG PO TABS
500.0000 mg | ORAL_TABLET | Freq: Once | ORAL | Status: AC
  Administered 2022-08-29: 500 mg via ORAL
  Filled 2022-08-29: qty 1

## 2022-08-29 MED ORDER — PENICILLIN V POTASSIUM 500 MG PO TABS: 500.0000 mg | ORAL_TABLET | Freq: Four times a day (QID) | ORAL | 0 refills | Status: AC

## 2022-08-29 NOTE — ED Triage Notes (Addendum)
Patient said he has had a crack in his right lower tooth for about 2 weeks. He is getting it removed Monday, but said the pain is too intense. He said "All I need is a pain shot."

## 2022-08-29 NOTE — Discharge Instructions (Signed)
-  Take the antibiotics as prescribed and follow-up with your dentist as scheduled.  Return to the ED with difficulty breathing, difficulty swallowing or other concerns.

## 2022-08-29 NOTE — ED Provider Notes (Signed)
[facility]-EMERGENCY DEPT Provider Note   CSN: [id number] Arrival date & time: 08/29/22  0757     History  Chief Complaint  Patient presents with   Dental Pain    Bobby Compton is a 41 y.o. male.  Patient presents with right lower incisor pain for the past 2 weeks.  States he is scheduled to have it removed on Monday.  Taking ibuprofen at home without relief.  No difficulty breathing or difficulty swallowing.  He has not had any bleeding or drainage.  States his dentist did give him a course of antibiotics which he finished.  He has had increased swelling to his lower jaw.  No difficulty breathing or difficulty swallowing.  No fever.  No vomiting.  States he is supposed to get the tooth removed on Monday.  The history is provided by the patient.  Dental Pain      Home Medications Prior to Admission medications   Medication Sig Start Date End Date Taking? Authorizing Provider  acetaminophen (TYLENOL) 500 MG tablet Take 1 tablet (500 mg total) by mouth every 6 (six) hours as needed. 07/19/22   Redwine, Madison A, PA-C  amoxicillin (AMOXIL) 500 MG capsule Take 1 capsule (500 mg total) by mouth 3 (three) times daily. 07/19/22   Redwine, Madison A, PA-C  HYDROcodone-acetaminophen (NORCO/VICODIN) 5-325 MG tablet Take 1 tablet by mouth every 6 (six) hours as needed. 07/19/22   Redwine, Madison A, PA-C  ibuprofen (ADVIL) 800 MG tablet Take 1 tablet (800 mg total) by mouth 3 (three) times daily. 07/19/22   Redwine, Madison A, PA-C  lidocaine (XYLOCAINE) 2 % solution Use as directed 15 mLs in the mouth or throat as needed for mouth pain. Swish and swallow in mouth and throat up to once every 6 hours for mouth and throat pain 07/19/22   Prosperi, Christian H, PA-C      Allergies    Pork-derived products    Review of Systems   Review of Systems  HENT:  Positive for dental problem.     Physical Exam Updated Vital Signs BP (!) 120/90 (BP Location: Left Arm)   Pulse  95   Temp 98.1 F (36.7 C) (Oral)   Resp 18   Ht 6\' 2"  (1.88 m)   Wt 97.5 kg   SpO2 100%   BMI 27.60 kg/m  Physical Exam Vitals and nursing note reviewed.  Constitutional:      General: He is not in acute distress.    Appearance: He is well-developed.  HENT:     Head: Normocephalic and atraumatic.     Mouth/Throat:     Pharynx: No oropharyngeal exudate.     Comments: TTP R lower incisor.  Mild induration without fluctuance.  Floor mouth is soft.  No trismus or malocclusion.  Eyes:     Conjunctiva/sclera: Conjunctivae normal.     Pupils: Pupils are equal, round, and reactive to light.  Neck:     Comments: No meningismus. Cardiovascular:     Rate and Rhythm: Normal rate and regular rhythm.     Heart sounds: Normal heart sounds. No murmur heard. Pulmonary:     Effort: Pulmonary effort is normal. No respiratory distress.     Breath sounds: Normal breath sounds.  Chest:     Chest wall: No tenderness.  Abdominal:     Palpations: Abdomen is soft.     Tenderness: There is no abdominal tenderness. There is no guarding or rebound.  Musculoskeletal:  General: No tenderness. Normal range of motion.     Cervical back: Normal range of motion and neck supple.  Skin:    General: Skin is warm.  Neurological:     Mental Status: He is alert and oriented to person, place, and time.     Cranial Nerves: No cranial nerve deficit.     Motor: No abnormal muscle tone.     Coordination: Coordination normal.     Comments:  5/5 strength throughout. CN 2-12 intact.Equal grip strength.   Psychiatric:        Behavior: Behavior normal.     ED Results / Procedures / Treatments   Labs (all labs ordered are listed, but only abnormal results are displayed) Labs Reviewed - No data to display  EKG None  Radiology No results found.  Procedures Procedures    Medications Ordered in ED Medications  HYDROmorphone (DILAUDID) injection 1 mg (has no administration in time range)   penicillin v potassium (VEETID) tablet 500 mg (has no administration in time range)    ED Course/ Medical Decision Making/ A&P                           Medical Decision Making Amount and/or Complexity of Data Reviewed Labs: ordered. Decision-making details documented in ED Course. Radiology: ordered and independent interpretation performed. Decision-making details documented in ED Course. ECG/medicine tests: ordered and independent interpretation performed. Decision-making details documented in ED Course.  Risk Prescription drug management.  Dental pain for the past 2 weeks.  May have early abscess without drainable fluctuance currently.  No evidence of Ludwig's angina.  He is requesting a "pain shot".  Discussed with patient that the pill would last longer than a shot but he states he wants a shot for his pain.  We will give course of antibiotics as he appears to have an early area of induration but no drainable fluctuance currently.  Follow-up on Monday first tooth extraction with his dentist.  Return to the ED with difficulty breathing, difficulty swallowing or other concerns.         Final Clinical Impression(s) / ED Diagnoses Final diagnoses:  None    Rx / DC Orders ED Discharge Orders     None         Kharlie Bring, Annie Main, MD 08/29/22 [phone]

## 2023-05-30 ENCOUNTER — Ambulatory Visit (HOSPITAL_COMMUNITY)
Admission: EM | Admit: 2023-05-30 | Discharge: 2023-05-30 | Disposition: A | Discharge status: Home / Self Care | Payer: MEDICAID | Attending: Family | Admitting: Family

## 2023-05-30 DIAGNOSIS — N10 Acute tubulo-interstitial nephritis: Secondary | ICD-10-CM | POA: Insufficient documentation

## 2023-05-30 DIAGNOSIS — F1120 Opioid dependence, uncomplicated: Secondary | ICD-10-CM | POA: Insufficient documentation

## 2023-05-30 MED ORDER — HYDROXYZINE HCL 25 MG PO TABS: 25.0000 mg | ORAL_TABLET | Freq: Three times a day (TID) | ORAL | 0 refills | Status: AC | PRN

## 2023-05-30 MED ORDER — NITROFURANTOIN MONOHYD MACRO 100 MG PO CAPS
100.0000 mg | ORAL_CAPSULE | Freq: Two times a day (BID) | ORAL | Status: DC
  Administered 2023-05-30: 100 mg via ORAL
  Filled 2023-05-30: qty 1

## 2023-05-30 MED ORDER — HYDROXYZINE HCL 25 MG PO TABS
25.0000 mg | ORAL_TABLET | Freq: Once | ORAL | Status: AC
  Administered 2023-05-30: 25 mg via ORAL
  Filled 2023-05-30: qty 1

## 2023-05-30 MED ORDER — NITROFURANTOIN MONOHYD MACRO 100 MG PO CAPS: 100.0000 mg | ORAL_CAPSULE | Freq: Two times a day (BID) | ORAL | 0 refills | Status: AC

## 2023-05-30 MED ORDER — ONDANSETRON 4 MG PO TBDP
4.0000 mg | ORAL_TABLET | Freq: Once | ORAL | Status: AC
  Administered 2023-05-30: 4 mg via ORAL
  Filled 2023-05-30: qty 1

## 2023-05-30 MED ORDER — ONDANSETRON HCL 4 MG PO TABS: 4.0000 mg | ORAL_TABLET | Freq: Every day | ORAL | 0 refills | Status: AC | PRN
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
## 2023-05-30 NOTE — Progress Notes (Signed)
   05/30/23 1628  [facility] Triage Screening (Walk-ins at [facility] only)  How Did You Hear About [place]? Family/Friend  What Is the Reason for Your Visit/Call Today? Pt presents to [facility] voluntarily accompanied by a family member due to opioid withdrawal. Pt is vomiting upon arrival, RN notified and is providing assistance. Pt reports last use of fentanyl was on 7/11 and he has been experiencing nausea, dizziness, visual hallucinations seeing black spots, tingling in his fingers and vomiting. Pt denies hx of seizures when detoxing. Pt denies SI/HI and AVH.  How Long Has This Been Causing You Problems? <Week  Have You Recently Had Any Thoughts About Hurting Yourself? No  Are You Planning to Commit Suicide/Harm Yourself At This time? No  Have you Recently Had Thoughts About Hurting Someone Karolee Ohs? No  Are You Planning To Harm Someone At This Time? No  Are you currently experiencing any auditory, visual or other hallucinations? Yes  Please explain the hallucinations you are currently experiencing: seeing black spots  Have You Used Any Alcohol or Drugs in the Past 24 Hours? No  Do you have any current medical co-morbidities that require immediate attention? No  Clinician description of patient physical appearance/behavior: vomiting, sweating, leaning over holding his stomach  What Do You Feel Would Help You the Most Today? Alcohol or Drug Use Treatment  If access to [facility] was not available, would you have sought care in the Emergency Department? No  Determination of Need Urgent (48 hours)  Options For Referral Facility-Based Crisis

## 2023-05-30 NOTE — Discharge Instructions (Signed)
